# Patient Record
Sex: Female | Born: 1960 | Race: Black or African American | Hispanic: No | Marital: Single | Smoking: Never smoker
Health system: Southern US, Community
[De-identification: ages and names within clinical notes are randomized; demographics above are authoritative.]

## PROBLEM LIST (undated history)

## (undated) DIAGNOSIS — L723 Sebaceous cyst: Secondary | ICD-10-CM

## (undated) DIAGNOSIS — E119 Type 2 diabetes mellitus without complications: Secondary | ICD-10-CM

## (undated) DIAGNOSIS — G4700 Insomnia, unspecified: Secondary | ICD-10-CM

## (undated) DIAGNOSIS — R739 Hyperglycemia, unspecified: Secondary | ICD-10-CM

## (undated) DIAGNOSIS — J302 Other seasonal allergic rhinitis: Secondary | ICD-10-CM

## (undated) DIAGNOSIS — D649 Anemia, unspecified: Secondary | ICD-10-CM

## (undated) DIAGNOSIS — I10 Essential (primary) hypertension: Secondary | ICD-10-CM

## (undated) DIAGNOSIS — Z91018 Allergy to other foods: Secondary | ICD-10-CM

## (undated) DIAGNOSIS — K1120 Sialoadenitis, unspecified: Secondary | ICD-10-CM

## (undated) DIAGNOSIS — N6009 Solitary cyst of unspecified breast: Secondary | ICD-10-CM

## (undated) DIAGNOSIS — B009 Herpesviral infection, unspecified: Secondary | ICD-10-CM

## (undated) DIAGNOSIS — A048 Other specified bacterial intestinal infections: Secondary | ICD-10-CM

## (undated) DIAGNOSIS — K5792 Diverticulitis of intestine, part unspecified, without perforation or abscess without bleeding: Secondary | ICD-10-CM

## (undated) DIAGNOSIS — B353 Tinea pedis: Secondary | ICD-10-CM

## (undated) DIAGNOSIS — E7800 Pure hypercholesterolemia, unspecified: Secondary | ICD-10-CM

## (undated) DIAGNOSIS — L309 Dermatitis, unspecified: Secondary | ICD-10-CM

## (undated) DIAGNOSIS — K5900 Constipation, unspecified: Secondary | ICD-10-CM

## (undated) HISTORY — DX: Other specified bacterial intestinal infections: A04.8

## (undated) HISTORY — DX: Morbid (severe) obesity due to excess calories: E66.01

## (undated) HISTORY — PX: ABDOMINAL HYSTERECTOMY: SHX81

## (undated) HISTORY — DX: Anemia, unspecified: D64.9

## (undated) HISTORY — DX: Tinea pedis: B35.3

## (undated) HISTORY — DX: Sialoadenitis, unspecified: K11.20

## (undated) HISTORY — DX: Insomnia, unspecified: G47.00

## (undated) HISTORY — DX: Sebaceous cyst: L72.3

## (undated) HISTORY — DX: Other seasonal allergic rhinitis: J30.2

## (undated) HISTORY — DX: Allergy to other foods: Z91.018

## (undated) HISTORY — DX: Herpesviral infection, unspecified: B00.9

## (undated) HISTORY — DX: Hyperglycemia, unspecified: R73.9

## (undated) HISTORY — DX: Dermatitis, unspecified: L30.9

## (undated) HISTORY — DX: Solitary cyst of unspecified breast: N60.09

## (undated) HISTORY — DX: Constipation, unspecified: K59.00

---

## 2013-05-09 ENCOUNTER — Encounter (HOSPITAL_COMMUNITY): Payer: Self-pay | Admitting: Emergency Medicine

## 2013-05-09 ENCOUNTER — Emergency Department (INDEPENDENT_AMBULATORY_CARE_PROVIDER_SITE_OTHER)
Admission: EM | Admit: 2013-05-09 | Discharge: 2013-05-09 | Disposition: A | Discharge status: Home / Self Care | Payer: PRIVATE HEALTH INSURANCE | Source: Home / Self Care | Attending: Emergency Medicine | Admitting: Emergency Medicine

## 2013-05-09 DIAGNOSIS — N3000 Acute cystitis without hematuria: Secondary | ICD-10-CM

## 2013-05-09 LAB — POCT URINALYSIS DIP (DEVICE)
Glucose, UA: 100 mg/dL — AB
HGB URINE DIPSTICK: NEGATIVE
Nitrite: POSITIVE — AB
PROTEIN: 100 mg/dL — AB
SPECIFIC GRAVITY, URINE: 1.02 (ref 1.005–1.030)
UROBILINOGEN UA: 2 mg/dL — AB (ref 0.0–1.0)
pH: 5 (ref 5.0–8.0)

## 2013-05-09 LAB — CBC WITH DIFFERENTIAL/PLATELET
BASOS PCT: 0 % (ref 0–1)
Basophils Absolute: 0 10*3/uL (ref 0.0–0.1)
Eosinophils Absolute: 0.1 10*3/uL (ref 0.0–0.7)
Eosinophils Relative: 3 % (ref 0–5)
HCT: 38.9 % (ref 36.0–46.0)
HEMOGLOBIN: 12.6 g/dL (ref 12.0–15.0)
Lymphocytes Relative: 37 % (ref 12–46)
Lymphs Abs: 2.1 10*3/uL (ref 0.7–4.0)
MCH: 27.9 pg (ref 26.0–34.0)
MCHC: 32.4 g/dL (ref 30.0–36.0)
MCV: 86.3 fL (ref 78.0–100.0)
MONOS PCT: 6 % (ref 3–12)
Monocytes Absolute: 0.3 10*3/uL (ref 0.1–1.0)
NEUTROS ABS: 3 10*3/uL (ref 1.7–7.7)
NEUTROS PCT: 54 % (ref 43–77)
Platelets: 245 10*3/uL (ref 150–400)
RBC: 4.51 MIL/uL (ref 3.87–5.11)
RDW: 13.7 % (ref 11.5–15.5)
WBC: 5.5 10*3/uL (ref 4.0–10.5)

## 2013-05-09 MED ORDER — CIPROFLOXACIN HCL 500 MG PO TABS: 500.0000 mg | ORAL_TABLET | Freq: Two times a day (BID) | ORAL | Status: DC

## 2013-05-09 MED ORDER — IBUPROFEN 800 MG PO TABS: 800.0000 mg | ORAL_TABLET | Freq: Three times a day (TID) | ORAL | Status: DC

## 2013-05-09 NOTE — Discharge Instructions (Signed)
Urinary Tract Infection  Urinary tract infections (UTIs) can develop anywhere along your urinary tract. Your urinary tract is your body's drainage system for removing wastes and extra water. Your urinary tract includes two kidneys, two ureters, a bladder, and a urethra. Your kidneys are a pair of bean-shaped organs. Each kidney is about the size of your fist. They are located below your ribs, one on each side of your spine.  CAUSES  Infections are caused by microbes, which are microscopic organisms, including fungi, viruses, and bacteria. These organisms are so small that they can only be seen through a microscope. Bacteria are the microbes that most commonly cause UTIs.  SYMPTOMS   Symptoms of UTIs may vary by age and gender of the patient and by the location of the infection. Symptoms in young women typically include a frequent and intense urge to urinate and a painful, burning feeling in the bladder or urethra during urination. Older women and men are more likely to be tired, shaky, and weak and have muscle aches and abdominal pain. A fever may mean the infection is in your kidneys. Other symptoms of a kidney infection include pain in your back or sides below the ribs, nausea, and vomiting.  DIAGNOSIS  To diagnose a UTI, your caregiver will ask you about your symptoms. Your caregiver also will ask to provide a urine sample. The urine sample will be tested for bacteria and white blood cells. White blood cells are made by your body to help fight infection.  TREATMENT   Typically, UTIs can be treated with medication. Because most UTIs are caused by a bacterial infection, they usually can be treated with the use of antibiotics. The choice of antibiotic and length of treatment depend on your symptoms and the type of bacteria causing your infection.  HOME CARE INSTRUCTIONS   If you were prescribed antibiotics, take them exactly as your caregiver instructs you. Finish the medication even if you feel better after you  have only taken some of the medication.   Drink enough water and fluids to keep your urine clear or pale yellow.   Avoid caffeine, tea, and carbonated beverages. They tend to irritate your bladder.   Empty your bladder often. Avoid holding urine for long periods of time.   Empty your bladder before and after sexual intercourse.   After a bowel movement, women should cleanse from front to back. Use each tissue only once.  SEEK MEDICAL CARE IF:    You have back pain.   You develop a fever.   Your symptoms do not begin to resolve within 3 days.  SEEK IMMEDIATE MEDICAL CARE IF:    You have severe back pain or lower abdominal pain.   You develop chills.   You have nausea or vomiting.   You have continued burning or discomfort with urination.  MAKE SURE YOU:    Understand these instructions.   Will watch your condition.   Will get help right away if you are not doing well or get worse.  Document Released: 10/24/2004 Document Revised: 07/16/2011 Document Reviewed: 02/22/2011  [employer] Patient Information 2014 [employer].

## 2013-05-09 NOTE — ED Notes (Signed)
Reports urinary urgency and frequent urination throughout this past week; on Friday started with RLQ pain.  Denies vaginal discharge, fevers, or n/v.  Has been taking AZO.

## 2013-05-09 NOTE — ED Notes (Signed)
Patient is resting.  Comfort measures offered.  Requesting PO fluids - instructed she needs to be NPO until we have lab result back.  Verbalized understanding.

## 2013-05-09 NOTE — ED Provider Notes (Signed)
Chief Complaint   Chief Complaint  Patient presents with  . Abdominal Pain  . Urinary Tract Infection    History of Present Illness   Katie Mcknight is a 53 year old female who's had a two-day history of pain in the right lower back and right lower quadrant of the abdomen. The pain is constant and rated a 4-8/10 in intensity. It's worse with movement and better when she lies flat. It has been associated with urinary frequency, urgency, and older to the urine. She denies any dysuria or hematuria. She has had no fever, chills, nausea, vomiting, or anorexia. She denies constipation, diarrhea, or blood in the stools. She has had no GYN complaints. She's not sexually active and has had a partial hysterectomy. No prior history of urinary tract infections or stones.  Review of Systems   Other than as noted above, the patient denies any of the following symptoms: Constitutional:  No fever, chills, weight loss or anorexia. Abdomen:  No nausea, vomiting, hematememesis, melena, diarrhea, or hematochezia. GU:  No dysuria, frequency, urgency, or hematuria. Gyn:  No vaginal discharge, itching, abnormal bleeding, dyspareunia, or pelvic pain.  PMFSH   Past medical history, family history, social history, meds, and allergies were reviewed. She is allergic to penicillin.  Physical Exam     Vital signs:  BP 130/81  Pulse 70  Temp(Src) 98.3 F (36.8 C) (Oral)  Resp 16  SpO2 98% Gen:  Alert, oriented, in no distress. Lungs:  Breath sounds clear and equal bilaterally.  No wheezes, rales or rhonchi. Heart:  Regular rhythm.  No gallops or murmers.   Abdomen:  Abdomen was soft, flat, nondistended. No organomegaly or mass. Bowel sounds are normally active. She has mild right lower quadrant tenderness to palpation without guarding or rebound. Skin:  Clear, warm and dry.  No rash.  Labs   Results for orders placed during the hospital encounter of 05/09/13  CBC WITH DIFFERENTIAL      Result Value  Ref Range   WBC 5.5  4.0 - 10.5 K/uL   RBC 4.51  3.87 - 5.11 MIL/uL   Hemoglobin 12.6  12.0 - 15.0 g/dL   HCT 16.138.9  09.636.0 - 04.546.0 %   MCV 86.3  78.0 - 100.0 fL   MCH 27.9  26.0 - 34.0 pg   MCHC 32.4  30.0 - 36.0 g/dL   RDW 40.913.7  81.111.5 - 91.415.5 %   Platelets 245  150 - 400 K/uL   Neutrophils Relative % 54  43 - 77 %   Neutro Abs 3.0  1.7 - 7.7 K/uL   Lymphocytes Relative 37  12 - 46 %   Lymphs Abs 2.1  0.7 - 4.0 K/uL   Monocytes Relative 6  3 - 12 %   Monocytes Absolute 0.3  0.1 - 1.0 K/uL   Eosinophils Relative 3  0 - 5 %   Eosinophils Absolute 0.1  0.0 - 0.7 K/uL   Basophils Relative 0  0 - 1 %   Basophils Absolute 0.0  0.0 - 0.1 K/uL  POCT URINALYSIS DIP (DEVICE)      Result Value Ref Range   Glucose, UA 100 (*) NEGATIVE mg/dL   Bilirubin Urine SMALL (*) NEGATIVE   Ketones, ur TRACE (*) NEGATIVE mg/dL   Specific Gravity, Urine 1.020  1.005 - 1.030   Hgb urine dipstick NEGATIVE  NEGATIVE   pH 5.0  5.0 - 8.0   Protein, ur 100 (*) NEGATIVE mg/dL   Urobilinogen, UA 2.0 (*)  0.0 - 1.0 mg/dL   Nitrite POSITIVE (*) NEGATIVE   Leukocytes, UA LARGE (*) NEGATIVE    Urine was cultured.  Assessment   The encounter diagnosis was Acute cystitis.  No evidence of acute abdomen or appendicitis.  Plan     1.  Meds:  The following meds were prescribed:   Discharge Medication List as of 05/09/2013  4:44 PM    START taking these medications   Details  ciprofloxacin (CIPRO) 500 MG tablet Take 1 tablet (500 mg total) by mouth every 12 (twelve) hours., Starting 05/09/2013, Until Discontinued, Normal    ibuprofen (ADVIL,MOTRIN) 800 MG tablet Take 1 tablet (800 mg total) by mouth 3 (three) times daily., Starting 05/09/2013, Until Discontinued, Normal        2.  Patient Education/Counseling:  The patient was given appropriate handouts, self care instructions, and instructed in symptomatic relief.  Get extra fluids and followup with her primary care doctor back in [place], [place] when she returns  home.  3.  Follow up:  The patient was told to follow up here if no better in 3 to 4 days, or sooner if becoming worse in any way, and given some red flag symptoms such as worsening pain, fever, vomiting, or evidence of GI bleeding which would prompt immediate return.  Follow up here as necessary.    Reuben Likes, MD 05/09/13 [phone]

## 2013-05-09 NOTE — ED Notes (Signed)
Redrawn CBC per lab request.

## 2013-05-10 LAB — URINE CULTURE

## 2013-05-10 NOTE — Progress Notes (Signed)
Quick Note:  Test result was normal. No further action is needed at this time. ______ 

## 2015-06-05 ENCOUNTER — Emergency Department (HOSPITAL_COMMUNITY)
Admission: EM | Admit: 2015-06-05 | Discharge: 2015-06-05 | Disposition: A | Discharge status: Home / Self Care | Payer: No Typology Code available for payment source | Attending: Emergency Medicine | Admitting: Emergency Medicine

## 2015-06-05 ENCOUNTER — Encounter (HOSPITAL_COMMUNITY): Payer: Self-pay | Admitting: *Deleted

## 2015-06-05 DIAGNOSIS — Y998 Other external cause status: Secondary | ICD-10-CM | POA: Diagnosis not present

## 2015-06-05 DIAGNOSIS — S0990XA Unspecified injury of head, initial encounter: Secondary | ICD-10-CM | POA: Insufficient documentation

## 2015-06-05 DIAGNOSIS — Y92410 Unspecified street and highway as the place of occurrence of the external cause: Secondary | ICD-10-CM | POA: Diagnosis not present

## 2015-06-05 DIAGNOSIS — Z880 Allergy status to penicillin: Secondary | ICD-10-CM | POA: Insufficient documentation

## 2015-06-05 DIAGNOSIS — Z791 Long term (current) use of non-steroidal anti-inflammatories (NSAID): Secondary | ICD-10-CM | POA: Diagnosis not present

## 2015-06-05 DIAGNOSIS — S6992XA Unspecified injury of left wrist, hand and finger(s), initial encounter: Secondary | ICD-10-CM | POA: Insufficient documentation

## 2015-06-05 DIAGNOSIS — S199XXA Unspecified injury of neck, initial encounter: Secondary | ICD-10-CM | POA: Diagnosis not present

## 2015-06-05 DIAGNOSIS — Y9389 Activity, other specified: Secondary | ICD-10-CM | POA: Diagnosis not present

## 2015-06-05 DIAGNOSIS — S29002A Unspecified injury of muscle and tendon of back wall of thorax, initial encounter: Secondary | ICD-10-CM | POA: Insufficient documentation

## 2015-06-05 DIAGNOSIS — S4991XA Unspecified injury of right shoulder and upper arm, initial encounter: Secondary | ICD-10-CM | POA: Insufficient documentation

## 2015-06-05 DIAGNOSIS — S99912A Unspecified injury of left ankle, initial encounter: Secondary | ICD-10-CM | POA: Diagnosis not present

## 2015-06-05 MED ORDER — METHOCARBAMOL 500 MG PO TABS: 500.0000 mg | ORAL_TABLET | Freq: Two times a day (BID) | ORAL | Status: DC

## 2015-06-05 MED ORDER — IBUPROFEN 800 MG PO TABS: 800.0000 mg | ORAL_TABLET | Freq: Three times a day (TID) | ORAL | Status: DC
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
## 2015-06-05 NOTE — ED Notes (Signed)
Declined W/C at D/C and was escorted to lobby by RN. 

## 2015-06-05 NOTE — Discharge Instructions (Signed)

## 2015-11-02 ENCOUNTER — Other Ambulatory Visit: Payer: Self-pay | Admitting: Family Medicine

## 2015-11-02 DIAGNOSIS — Z1231 Encounter for screening mammogram for malignant neoplasm of breast: Secondary | ICD-10-CM

## 2015-11-08 ENCOUNTER — Ambulatory Visit
Admission: RE | Admit: 2015-11-08 | Discharge: 2015-11-08 | Disposition: A | Discharge status: Home / Self Care | Payer: Managed Care, Other (non HMO) | Source: Ambulatory Visit | Attending: Family Medicine | Admitting: Family Medicine

## 2015-11-08 DIAGNOSIS — Z1231 Encounter for screening mammogram for malignant neoplasm of breast: Secondary | ICD-10-CM

## 2016-11-11 ENCOUNTER — Other Ambulatory Visit: Payer: Self-pay | Admitting: Family Medicine

## 2016-11-11 DIAGNOSIS — R1031 Right lower quadrant pain: Secondary | ICD-10-CM

## 2016-11-18 ENCOUNTER — Other Ambulatory Visit: Payer: Managed Care, Other (non HMO)

## 2017-02-07 ENCOUNTER — Encounter: Payer: Self-pay | Admitting: Registered"

## 2017-02-07 ENCOUNTER — Encounter: Payer: Managed Care, Other (non HMO) | Attending: Family Medicine | Admitting: Registered"

## 2017-02-07 DIAGNOSIS — Z713 Dietary counseling and surveillance: Secondary | ICD-10-CM | POA: Diagnosis not present

## 2017-02-07 DIAGNOSIS — E663 Overweight: Secondary | ICD-10-CM | POA: Insufficient documentation

## 2017-02-07 NOTE — Progress Notes (Signed)
Medical Nutrition Therapy:  Appt start time: 1100 end time:  1200.  Assessment:  Primary concerns today: Patient states she has a family history of diabetes and other health issues and wants to take action to prevent health issues. Per referral lab she has a diagnosis of hyperglycemia (pre-diabetes?). Patient states she wants to understand how to read labels.   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 Patient states she worked nights and since retirement she has continued to stay awake during the night and has an irregular sleep pattern. Patient reports not getting adequate sleep and will be checking with Dr. Duanne Guessewey tomorrow regarding her concerns of potential sleep apnea.   Patient is motivated to have healthy habits not only for herself, but also to help instill this in her daughter.   Patient states she will be Preferred Learning Style:   No preference indicated   Learning Readiness:   Change in progress  MEDICATIONS: reviewed   DIETARY INTAKE:  Usual eating pattern includes 3 meals and 1-2 snacks per day.  Everyday foods include salads.  Avoided foods include food allergies, tomatoes and citrus.    24-hr recall:  B ( AM): bacon, hard boiled egg, 1/2 toast OR cream of wheat OR oatmeal OR frozen fruit smoothie  Snk ( AM): none  L ( PM): salads, chicken or tuna OR eating out fish or chicken, eats only 1/2 bun Snk ( PM): none D ( PM): salmon/chicken, greens, sometimes a little rice or pasta Snk ( PM): (while watching TV) sunflower seeds or chips Beverages: water, soda with Sunday dinner, seltzer water, ginseng green tea  Usual physical activity: ADLs Plans to start walking. Used to walk 5-6 miles 2-3 week. May get bike  Estimated energy needs: 1800 calories 200 g carbohydrates 113 g protein 60 g fat  Progress Towards Goal(s):  In progress.   Nutritional Diagnosis:  NB-1.1 Food and nutrition-related knowledge deficit As related to sources of concentrated  sweets.  As evidenced by belief that green tea choice was low in sugar due to taste and not reading label and diagnosis of hyperglycemia.    Intervention:  Nutrition Education. Described the role of different macronutrients on glucose. Stated what foods contain the most carbohydrates. Demonstrated how to read Nutrition Facts food label. Discussed omega 3 food sources. Discussed importance of sleep and exercise on health.   Teaching Method Utilized:  Visual Auditory  Handouts given during visit include:  Fish guide  My Plate Planner  Carb sheet  Smoothie recipes  Sleep Hygiene  A1c chart  Nutrition Facts Label  Types of Sugar  Barriers to learning/adherence to lifestyle change: none  Demonstrated degree of understanding via:  Teach Back   Monitoring/Evaluation:  Dietary intake, exercise, fasting glucose labs, and body weight in 4 month(s).

## 2017-02-07 NOTE — Patient Instructions (Addendum)
Consider replacing the green tea with something else with less sugar. Aim to eat balanced meals and snacks including a protein to keep your blood sugar in good control. Work on getting into a regular exercise routine. Aim to get 7-8 hours sleep per night Ask your doctor what your blood sugar number is and A1c if she has done this test.

## 2017-02-20 ENCOUNTER — Other Ambulatory Visit: Payer: Self-pay | Admitting: Family Medicine

## 2017-02-20 DIAGNOSIS — Z1231 Encounter for screening mammogram for malignant neoplasm of breast: Secondary | ICD-10-CM

## 2017-03-11 ENCOUNTER — Ambulatory Visit: Payer: Managed Care, Other (non HMO)

## 2017-03-20 ENCOUNTER — Institutional Professional Consult (permissible substitution): Payer: Self-pay | Admitting: Neurology

## 2017-03-31 ENCOUNTER — Encounter: Payer: Self-pay | Admitting: Neurology

## 2017-03-31 ENCOUNTER — Ambulatory Visit: Payer: Managed Care, Other (non HMO) | Admitting: Neurology

## 2017-03-31 ENCOUNTER — Encounter (INDEPENDENT_AMBULATORY_CARE_PROVIDER_SITE_OTHER): Payer: Self-pay

## 2017-03-31 VITALS — BP 128/80 | HR 76 | Ht 65.0 in | Wt 209.0 lb

## 2017-03-31 DIAGNOSIS — G4719 Other hypersomnia: Secondary | ICD-10-CM

## 2017-03-31 DIAGNOSIS — R0683 Snoring: Secondary | ICD-10-CM | POA: Diagnosis not present

## 2017-03-31 DIAGNOSIS — G478 Other sleep disorders: Secondary | ICD-10-CM

## 2017-03-31 DIAGNOSIS — R0681 Apnea, not elsewhere classified: Secondary | ICD-10-CM | POA: Diagnosis not present

## 2017-03-31 DIAGNOSIS — E669 Obesity, unspecified: Secondary | ICD-10-CM

## 2017-03-31 DIAGNOSIS — R351 Nocturia: Secondary | ICD-10-CM

## 2017-03-31 DIAGNOSIS — R002 Palpitations: Secondary | ICD-10-CM | POA: Diagnosis not present

## 2017-03-31 DIAGNOSIS — G4762 Sleep related leg cramps: Secondary | ICD-10-CM | POA: Diagnosis not present

## 2017-03-31 DIAGNOSIS — G479 Sleep disorder, unspecified: Secondary | ICD-10-CM | POA: Diagnosis not present

## 2017-03-31 NOTE — Progress Notes (Signed)
Subjective:    Patient ID: Katie Mcknight is a 57 y.o. female.  HPI     Huston Foley, MD, PhD [facility] [address] [address] [place], [place] [address]  Dear Katie Mcknight,  I saw your patient, Katie Mcknight, upon your kind request in my neurologic clinic today for initial consultation of her sleep disorder, in particular, concern for underlying obstructive sleep apnea. The patient is unaccompanied today. As you know, Katie Mcknight is a 57 year old right-handed woman with an underlying medical history of vitamin D deficiency, allergic rhinitis, anemia, irritable bowel syndrome and obesity, who reports snoring and excessive daytime somnolence. She has had witnessed breathing pauses while asleep per family. I reviewed your office note from 02/07/2017, which you kindly included. Her Epworth Sleepiness Scale score is 924 today, fatigue score is 42 out of 63. She wakes up with a sense of gasping, palpitations and shortness of breath. She gets scared at night. She tries to walk around and leave the bedroom when she wakes up with a jolt or startle like this. She lives with her daughter, she is retired. She has 2 children. Her older daughter is 56 years old and lives and works in [place], her younger daughter is 109 and in high school. She is a retired Emergency planning/management officer, retired some 3 years ago. She does not have a very good sleep awake schedule she admits. She goes to bed around 11 but due to her prior work schedule she is awake until early morning hours typically, watches TV in bed, reading, is more active at night she reports. She may be asleep by 3 AM typically. She wakes up with foot cramping at times. She tries to hydrate well. She does not drink caffeine on a day-to-day basis, does not drink alcohol on a regular basis, is a nonsmoker. Right time varies. She gets up at 7 for her daughter and then goes back to bed and typically may sleep until 3 PM or so. She is not aware  of any family history of OSA. He denies telltale symptoms of restless leg syndrome. She does not typically wake up with a headache, she has nocturia about once per average night.  Her Past Medical History Is Significant For: No past medical history on file.  Her Past Surgical History Is Significant For: Past Surgical History:  Procedure Laterality Date  . ABDOMINAL HYSTERECTOMY     partial    Her Family History Is Significant For: No family history on file.  Her Social History Is Significant For: Social History   Socioeconomic History  . Marital status: Single    Spouse name: None  . Number of children: None  . Years of education: None  . Highest education level: None  Social Needs  . Financial resource strain: None  . Food insecurity - worry: None  . Food insecurity - inability: None  . Transportation needs - medical: None  . Transportation needs - non-medical: None  Occupational History  . None  Tobacco Use  . Smoking status: Never Smoker  . Smokeless tobacco: Never Used  Substance and Sexual Activity  . Alcohol use: No  . Drug use: No  . Sexual activity: No  Other Topics Concern  . None  Social History Narrative  . None    Her Allergies Are:  Allergies  Allergen Reactions  . Penicillins   :   Her Current Medications Are:  Outpatient Encounter Medications as of 03/31/2017  Medication Sig  .  Cholecalciferol (VITAMIN D-3) 5000 units TABS Take by mouth.  . hydrochlorothiazide (HYDRODIURIL) 25 MG tablet Take 25 mg by mouth daily.  . [DISCONTINUED] Docusate Sodium (COLACE PO) Take by mouth.  . [DISCONTINUED] ibuprofen (ADVIL,MOTRIN) 800 MG tablet Take 1 tablet (800 mg total) by mouth 3 (three) times daily.   No facility-administered encounter medications on file as of 03/31/2017.   :  Review of Systems:  Out of a complete 14 point review of systems, all are reviewed and negative with the exception of these symptoms as listed below: Review of Systems   Neurological:       Pt presents today to discuss her sleep. Pt has never had a sleep study but does endorse snoring.  Epworth Sleepiness Scale 0= would never doze 1= slight chance of dozing 2= moderate chance of dozing 3= high chance of dozing  Sitting and reading: 1 Watching TV: 2 Sitting inactive in a public place (ex. Theater or meeting): 0 As a passenger in a car for an hour without a break: 1 Lying down to rest in the afternoon: 3 Sitting and talking to someone: 0 Sitting quietly after lunch (no alcohol): 1 In a car, while stopped in traffic: 1 Total: 9     Objective:  Neurological Exam  Physical Exam Physical Examination:   Vitals:   03/31/17 1324  BP: 128/80  Pulse: 76   General Examination: The patient is a very pleasant 57 y.o. female in no acute distress. She appears well-developed and well-nourished and well groomed.   HEENT: Normocephalic, atraumatic, pupils are equal, round and reactive to light and accommodation. Extraocular tracking is good without limitation to gaze excursion or nystagmus noted. Normal smooth pursuit is noted. Hearing is grossly intact. Tympanic membranes are clear bilaterally. Face is symmetric with normal facial animation and normal facial sensation. Speech is clear with no dysarthria noted. There is no hypophonia. There is no lip, neck/head, jaw or voice tremor. Neck is supple with full range of passive and active motion. There are no carotid bruits on auscultation. Oropharynx exam reveals: mild mouth dryness, adequate dental hygiene and moderate airway crowding, due to smaller airway entry. Mallampati is class II. Tongue protrudes centrally and palate elevates symmetrically. Tonsils are 1+ to 2+ in size. Neck size is 16 inches. She has a Mild overbite.    Chest: Clear to auscultation without wheezing, rhonchi or crackles noted.  Heart: S1+S2+0, regular and normal without murmurs, rubs or gallops noted.   Abdomen: Soft, non-tender and  non-distended with normal bowel sounds appreciated on auscultation.  Extremities: There is no pitting edema in the distal lower extremities bilaterally.    Skin: Warm and dry without trophic changes noted.   Musculoskeletal: exam reveals no obvious joint deformities, tenderness or joint swelling or erythema.   Neurologically:  Mental status: The patient is awake, alert and oriented in all 4 spheres. Her immediate and remote memory, attention, language skills and fund of knowledge are appropriate. There is no evidence of aphasia, agnosia, apraxia or anomia. Speech is clear with normal prosody and enunciation. Thought process is linear. Mood is normal and affect is normal.  Cranial nerves II - XII are as described above under HEENT exam. In addition: shoulder shrug is normal with equal shoulder height noted. Motor exam: Normal bulk, strength and tone is noted. There is no drift, tremor or rebound. Romberg is negative. Reflexes are 1+ in the UEs and knees. Fine motor skills and coordination: intact with normal finger taps, normal hand movements,  normal rapid alternating patting, normal foot taps and normal foot agility.  Cerebellar testing: No dysmetria or intention tremor on finger to nose testing. Heel to shin is unremarkable bilaterally. There is no truncal or gait ataxia.  Sensory exam: intact to light touch in the upper and lower extremities.  Gait, station and balance: She stands easily. No veering to one side is noted. No leaning to one side is noted. Posture is age-appropriate and stance is narrow based. Gait shows normal stride length and normal pace. No problems turning are noted. Tandem walk is unremarkable. Intact toe and heel stance is noted.               Assessment and Plan:   In summary, Katie Mcknight is a very pleasant 57 y.o.-year old female with an underlying medical history of vitamin D deficiency, allergic rhinitis, anemia, irritable bowel syndrome and obesity, whose history  and physical exam are concerning for obstructive sleep apnea (OSA). I had a long chat with the patient about my findings and the diagnosis of OSA, its prognosis and treatment options. We talked about medical treatments, surgical interventions and non-pharmacological approaches. I explained in particular the risks and ramifications of untreated moderate to severe OSA, especially with respect to developing cardiovascular disease down the Road, including congestive heart failure, difficult to treat hypertension, cardiac arrhythmias, or stroke. Even type 2 diabetes has, in part, been linked to untreated OSA. Symptoms of untreated OSA include daytime sleepiness, memory problems, mood irritability and mood disorder such as depression and anxiety, lack of energy, as well as recurrent headaches, especially morning headaches. We talked about trying to maintain a healthy lifestyle in general, as well as the importance of weight control. I encouraged the patient to eat healthy, exercise daily and keep well hydrated, to keep a scheduled bedtime and wake time routine, to not skip any meals and eat healthy snacks in between meals. I advised the patient not to drive when feeling sleepy. I recommended the following at this time: sleep study with potential positive airway pressure titration. (We will score hypopneas at 4%).   I explained the sleep test procedure to the patient and also outlined possible surgical and non-surgical treatment options of OSA, including the use of a custom-made dental device (which would require a referral to a specialist dentist or oral surgeon), upper airway surgical options, such as pillar implants, radiofrequency surgery, tongue base surgery, and UPPP (which would involve a referral to an ENT surgeon). Rarely, jaw surgery such as mandibular advancement may be considered.  I also explained the CPAP treatment option to the patient, who indicated that she would be willing to try CPAP if the need  arises. I explained the importance of being compliant with PAP treatment, not only for insurance purposes but primarily to improve Her symptoms, and for the patient's long term health benefit, including to reduce Her cardiovascular risks. I answered all her questions today and the patient was in agreement. I would like to see her back after the sleep study is completed and encouraged her to call with any interim questions, concerns, problems or updates.   Thank you very much for allowing me to participate in the care of this nice patient. If I can be of any further assistance to you please do not hesitate to call me at [phone].  Sincerely,   Huston Foley, MD, PhD

## 2017-03-31 NOTE — Patient Instructions (Addendum)

## 2017-04-03 ENCOUNTER — Telehealth: Payer: Self-pay

## 2017-04-03 DIAGNOSIS — G4719 Other hypersomnia: Secondary | ICD-10-CM

## 2017-04-03 NOTE — Telephone Encounter (Signed)
Will wait until Dr. Athar's return to address. 

## 2017-04-03 NOTE — Telephone Encounter (Signed)
[employer] denied in lab sleep study. Need HST order

## 2017-04-07 NOTE — Telephone Encounter (Signed)
HST order placed. 

## 2017-04-23 ENCOUNTER — Encounter (HOSPITAL_COMMUNITY): Payer: Self-pay | Admitting: *Deleted

## 2017-04-23 ENCOUNTER — Emergency Department (HOSPITAL_COMMUNITY)
Admission: EM | Admit: 2017-04-23 | Discharge: 2017-04-24 | Disposition: A | Discharge status: Home / Self Care | Payer: Managed Care, Other (non HMO) | Attending: Emergency Medicine | Admitting: Emergency Medicine

## 2017-04-23 DIAGNOSIS — Z79899 Other long term (current) drug therapy: Secondary | ICD-10-CM | POA: Insufficient documentation

## 2017-04-23 DIAGNOSIS — R112 Nausea with vomiting, unspecified: Secondary | ICD-10-CM | POA: Diagnosis present

## 2017-04-23 DIAGNOSIS — K529 Noninfective gastroenteritis and colitis, unspecified: Secondary | ICD-10-CM | POA: Insufficient documentation

## 2017-04-23 LAB — COMPREHENSIVE METABOLIC PANEL
ALT: 29 U/L (ref 14–54)
AST: 26 U/L (ref 15–41)
Albumin: 4.4 g/dL (ref 3.5–5.0)
Alkaline Phosphatase: 49 U/L (ref 38–126)
Anion gap: 12 (ref 5–15)
BILIRUBIN TOTAL: 0.7 mg/dL (ref 0.3–1.2)
BUN: 16 mg/dL (ref 6–20)
CO2: 25 mmol/L (ref 22–32)
CREATININE: 0.91 mg/dL (ref 0.44–1.00)
Calcium: 9.4 mg/dL (ref 8.9–10.3)
Chloride: 102 mmol/L (ref 101–111)
GFR calc Af Amer: >60 mL/min (ref >60)
Glucose, Bld: 119 mg/dL — ABNORMAL HIGH (ref 65–99)
Potassium: 3.9 mmol/L (ref 3.5–5.1)
Sodium: 139 mmol/L (ref 135–145)
Total Protein: 7.9 g/dL (ref 6.5–8.1)

## 2017-04-23 LAB — URINALYSIS, ROUTINE W REFLEX MICROSCOPIC
Bilirubin Urine: NEGATIVE
GLUCOSE, UA: NEGATIVE mg/dL
Hgb urine dipstick: NEGATIVE
Ketones, ur: NEGATIVE mg/dL
Nitrite: NEGATIVE
PH: 6 (ref 5.0–8.0)
PROTEIN: NEGATIVE mg/dL
SPECIFIC GRAVITY, URINE: 1.015 (ref 1.005–1.030)

## 2017-04-23 LAB — CBC
HCT: 41.1 % (ref 36.0–46.0)
Hemoglobin: 13 g/dL (ref 12.0–15.0)
MCH: 27.8 pg (ref 26.0–34.0)
MCHC: 31.6 g/dL (ref 30.0–36.0)
MCV: 88 fL (ref 78.0–100.0)
Platelets: 218 10*3/uL (ref 150–400)
RBC: 4.67 MIL/uL (ref 3.87–5.11)
RDW: 14.2 % (ref 11.5–15.5)
WBC: 9.6 10*3/uL (ref 4.0–10.5)

## 2017-04-23 LAB — LIPASE, BLOOD: Lipase: 28 U/L (ref 11–51)

## 2017-04-23 MED ORDER — ONDANSETRON 4 MG PO TBDP: ORAL_TABLET | ORAL | 0 refills | Status: AC

## 2017-04-23 MED ORDER — SODIUM CHLORIDE 0.9 % IV BOLUS
1000.0000 mL | Freq: Once | INTRAVENOUS | Status: AC
  Administered 2017-04-23: 1000 mL via INTRAVENOUS

## 2017-04-23 MED ORDER — ONDANSETRON HCL 4 MG/2ML IJ SOLN
4.0000 mg | Freq: Once | INTRAMUSCULAR | Status: AC
  Administered 2017-04-23: 4 mg via INTRAVENOUS
  Filled 2017-04-23: qty 2

## 2017-04-23 NOTE — ED Provider Notes (Signed)
[facility]-EMERGENCY DEPT Provider Note   CSN: [id number] Arrival date & time: 04/23/17  1541     History   Chief Complaint Chief Complaint  Patient presents with  . Nausea  . Abdominal Pain    HPI Katie Mcknight is a 57 y.o. female.  Patient is a 57 year old female who presents with nausea vomiting diarrhea.  She states that she ate some cream of wheat this morning and definitely and then later this afternoon he had sudden onset of cramping in her stomach that resulted in diarrhea and nausea and vomiting.  While she was in the bathroom having diarrhea, she had a near syncopal event where she got dizzy and fell to the ground.  She did not get injured.  There was no syncope.  She states she had similar episodes of dizziness in the past.  She feels better but still feels generally weak.  She still having some abdominal cramping and generalized tenderness.  She still has some nausea.  No fevers.  No hematemesis.  No bilious emesis.  No blood in her stool.  No known sick contacts.     History reviewed. No pertinent past medical history.  There are no active problems to display for this patient.   Past Surgical History:  Procedure Laterality Date  . ABDOMINAL HYSTERECTOMY     partial     OB History   None      Home Medications    Prior to Admission medications   Medication Sig Start Date End Date Taking? Authorizing Provider  Cholecalciferol (VITAMIN D-3) 5000 units TABS Take 5,000 Units by mouth daily.    Yes [provider]  hydrochlorothiazide (HYDRODIURIL) 25 MG tablet Take 25 mg by mouth daily.   Yes [provider]  ondansetron (ZOFRAN ODT) 4 MG disintegrating tablet 4mg  ODT q4 hours prn nausea/vomit 04/23/17   Rolan BuccoBelfi, Twylla Arceneaux, MD    Family History No family history on file.  Social History Social History   Tobacco Use  . Smoking status: Never Smoker  . Smokeless tobacco: Never Used  Substance Use Topics  .  Alcohol use: No  . Drug use: No     Allergies   Penicillins   Review of Systems Review of Systems  Constitutional: Positive for fatigue. Negative for chills, diaphoresis and fever.  HENT: Negative for congestion, rhinorrhea and sneezing.   Eyes: Negative.   Respiratory: Negative for cough, chest tightness and shortness of breath.   Cardiovascular: Negative for chest pain and leg swelling.  Gastrointestinal: Positive for abdominal pain, diarrhea, nausea and vomiting. Negative for blood in stool.  Genitourinary: Negative for difficulty urinating, flank pain, frequency and hematuria.  Musculoskeletal: Negative for arthralgias and back pain.  Skin: Negative for rash.  Neurological: Positive for light-headedness. Negative for dizziness, speech difficulty, weakness, numbness and headaches.     Physical Exam Updated Vital Signs BP (!) 158/86   Pulse 80   Temp 98.1 F (36.7 C) (Oral)   Resp (!) 24   SpO2 100%   Physical Exam  Constitutional: She is oriented to person, place, and time. She appears well-developed and well-nourished.  HENT:  Head: Normocephalic and atraumatic.  Eyes: Pupils are equal, round, and reactive to light.  Neck: Normal range of motion. Neck supple.  Cardiovascular: Normal rate, regular rhythm and normal heart sounds.  Pulmonary/Chest: Effort normal and breath sounds normal. No respiratory distress. She has no wheezes. She has no rales. She exhibits no tenderness.  Abdominal: Soft. Bowel sounds  are normal. There is generalized tenderness. There is no rebound and no guarding.  Musculoskeletal: Normal range of motion. She exhibits no edema.  Lymphadenopathy:    She has no cervical adenopathy.  Neurological: She is alert and oriented to person, place, and time.   motor 5 out of 5 all extremities, sensation grossly intact light touch all extremities  Skin: Skin is warm and dry. No rash noted.  Psychiatric: She has a normal mood and affect.     ED  Treatments / Results  Labs (all labs ordered are listed, but only abnormal results are displayed) Labs Reviewed  COMPREHENSIVE METABOLIC PANEL - Abnormal; Notable for the following components:      Result Value   Glucose, Bld 119 (*)    All other components within normal limits  URINALYSIS, ROUTINE W REFLEX MICROSCOPIC - Abnormal; Notable for the following components:   Leukocytes, UA TRACE (*)    Bacteria, UA RARE (*)    Squamous Epithelial / LPF 0-5 (*)    All other components within normal limits  LIPASE, BLOOD  CBC    EKG EKG Interpretation  Date/Time:  Wednesday April 23 2017 21:38:48 EDT Ventricular Rate:  84 PR Interval:    QRS Duration: 81 QT Interval:  382 QTC Calculation: 452 R Axis:   82 Text Interpretation:  Sinus rhythm Borderline T abnormalities, anterior leads No old tracing to compare Confirmed by Rolan Bucco [phone]) on 04/23/2017 11:25:24 PM   Radiology No results found.  Procedures Procedures (including critical care time)  Medications Ordered in ED Medications  sodium chloride 0.9 % bolus 1,000 mL (0 mLs Intravenous Stopped 04/23/17 2319)  ondansetron (ZOFRAN) injection 4 mg (4 mg Intravenous Given 04/23/17 2106)     Initial Impression / Assessment and Plan / ED Course  I have reviewed the triage vital signs and the nursing notes.  Pertinent labs & imaging results that were available during my care of the patient were reviewed by me and considered in my medical decision making (see chart for details).    Patient is a 57 year old female who presents with sudden onset of diarrhea associated with nausea and vomiting.  She had a brief near syncopal type event.  She had similar events in the past.  It sounds most likely to be a vasovagal type spell.  She initially had some mild generalized abdominal pain but after IV fluids and Zofran she is feeling better.  Her repeat abdominal exam is benign.  She is not complaining of any ongoing pain.  She is able to  ambulate without dizziness.  She is tolerating oral fluids.  I feel her symptoms are likely viral in nature.  Her labs are non-concerning.  She was discharged home in good condition.  She was given a prescription for Zofran.  Return precautions were given.   Final Clinical Impressions(s) / ED Diagnoses   Final diagnoses:  Gastroenteritis    ED Discharge Orders        Ordered    ondansetron (ZOFRAN ODT) 4 MG disintegrating tablet     04/23/17 2342       Rolan Bucco, MD 04/23/17 2346

## 2017-04-23 NOTE — ED Triage Notes (Signed)
Per EMS, pt from home complains of n/v/d for the past day. Pt states she has generalized abdominal pain.  BP 116/76 HR 60 RR 16 O2 98% CBG 107

## 2017-04-23 NOTE — ED Notes (Signed)
Ambulated patient to the bathroom and back to stretcher. Patient tolerated it well. Also, provided patient a Mal[place]urkey sandwich, saltine crackers, and ginger ale.

## 2017-04-23 NOTE — ED Notes (Signed)
Provided patient ice chips.  

## 2017-04-23 NOTE — ED Notes (Signed)
Main lab is going to attempt for blood samples.

## 2017-04-30 ENCOUNTER — Ambulatory Visit (INDEPENDENT_AMBULATORY_CARE_PROVIDER_SITE_OTHER): Payer: Managed Care, Other (non HMO) | Admitting: Neurology

## 2017-04-30 DIAGNOSIS — R0683 Snoring: Secondary | ICD-10-CM

## 2017-04-30 DIAGNOSIS — G4719 Other hypersomnia: Secondary | ICD-10-CM

## 2017-04-30 DIAGNOSIS — G4710 Hypersomnia, unspecified: Secondary | ICD-10-CM

## 2017-05-06 ENCOUNTER — Telehealth: Payer: Self-pay

## 2017-05-06 NOTE — Procedures (Signed)
  [facility] [address] [place], [place] [address] NAME:  Katie ChurchDeidra Haubner                                                                    DOB: 08/20/1960 MEDICAL RECORD [id number]                                                     DOS: 04/30/2017 REFERRING PHYSICIAN: Maryelizabeth RowanElizabeth Dewey, MD STUDY PERFORMED: Home Sleep Test  HISTORY: 57 year old woman with a history of vitamin D deficiency, allergic rhinitis, anemia, irritable bowel syndrome and obesity, who reports snoring and excessive daytime somnolence. Her Epworth Sleepiness Scale score is 9/24. BMI: 34.7.   STUDY RESULTS:  Total Recording Time: 6 hours, 12 minutes Total Apnea/Hypopnea Index (AHI): 4.5/h, RDI was 8.6/h Average Oxygen Saturation: 95%, Lowest Oxygen Desaturation: 88%  Total Time Oxygen Saturation Below or at 88% was 0.230minutes  Average Heart Rate: 70 bpm Snoring events: 394 IMPRESSION: Primary snoring RECOMMENDATION: This study does not demonstrate any significant obstructive or central sleep disordered breathing. Some snoring was noted. For disturbing snoring, an oral appliance (through a qualified dentist) can be considered. This study does not support an intrinsic sleep disorder as a cause of the patient's symptoms. Other causes, including circadian rhythm disturbances, an underlying mood disorder, medication effect and/or an underlying medical problem cannot be ruled out. The patient should be cautioned not to drive, work at heights, or operate dangerous or heavy equipment when tired or sleepy. Review and reiteration of good sleep hygiene measures should be pursued with any patient. The patient can follow-up with her referring provider, who will be notified of the test results. I certify that I have reviewed the raw data recording prior to the issuance of this report in accordance with the standards of the American Academy of Sleep Medicine (AASM). Huston FoleySaima Cortasia Screws, MD, PhD Diplomat, ABPN  (Neurology and Sleep)

## 2017-05-06 NOTE — Telephone Encounter (Signed)
I called pt to discuss her sleep study results. No answer, left a message asking her to call me back. 

## 2017-05-06 NOTE — Telephone Encounter (Signed)
-----   Message from Huston FoleySaima Athar, MD sent at 05/06/2017  2:16 PM EDT ----- Patient referred by Dr. Duanne Guessewey, seen by me on 03/31/17, HST on 04/30/17.   Please call and notify the patient that the recent home sleep test did not show any significant obstructive sleep apnea. Some snoring was noted. For disturbing snoring, an oral appliance (through a qualified dentist) can be considered.  Please remind patient to try to maintain good sleep hygiene, which means: Keep a regular sleep and wake schedule and make enough time for sleep (7 1/2 to 8 1/2 hours for the average adult), try not to exercise or have a meal within 2 hours of your bedtime, try to keep your bedroom conducive for sleep, that is, cool and dark, without light distractors such as an illuminated alarm clock, and refrain from watching TV right before sleep or in the middle of the night and do not keep the TV or radio on during the night. If a nightlight is used, have it away from the visual field. Also, try not to use or play on electronic devices at bedtime, such as your cell phone, tablet PC or laptop. If you like to read at bedtime on an electronic device, try to dim the background light as much as possible. Do not eat in the middle of the night. Keep pets away from the bedroom environment. For stress relief, try meditation, deep breathing exercises (there are many books and CDs available), a white noise machine or fan can help to diffuse other noise distractors, such as traffic noise. Do not drink alcohol before bedtime, as it can disturb sleep and cause middle of the night awakenings. Never mix alcohol and sedating medications! Avoid narcotic pain medication close to bedtime, as opioids/narcotics can suppress breathing drive and breathing effort.   She can follow up with the referring provider.   Thanks,  Huston FoleySaima Athar, MD, PhD [facility]

## 2017-05-06 NOTE — Progress Notes (Signed)
Patient referred by Dr. Duanne Guessewey, seen by me on 03/31/17, HST on 04/30/17.   Please call and notify the patient that the recent home sleep test did not show any significant obstructive sleep apnea. Some snoring was noted. For disturbing snoring, an oral appliance (through a qualified dentist) can be considered.  Please remind patient to try to maintain good sleep hygiene, which means: Keep a regular sleep and wake schedule and make enough time for sleep (7 1/2 to 8 1/2 hours for the average adult), try not to exercise or have a meal within 2 hours of your bedtime, try to keep your bedroom conducive for sleep, that is, cool and dark, without light distractors such as an illuminated alarm clock, and refrain from watching TV right before sleep or in the middle of the night and do not keep the TV or radio on during the night. If a nightlight is used, have it away from the visual field. Also, try not to use or play on electronic devices at bedtime, such as your cell phone, tablet PC or laptop. If you like to read at bedtime on an electronic device, try to dim the background light as much as possible. Do not eat in the middle of the night. Keep pets away from the bedroom environment. For stress relief, try meditation, deep breathing exercises (there are many books and CDs available), a white noise machine or fan can help to diffuse other noise distractors, such as traffic noise. Do not drink alcohol before bedtime, as it can disturb sleep and cause middle of the night awakenings. Never mix alcohol and sedating medications! Avoid narcotic pain medication close to bedtime, as opioids/narcotics can suppress breathing drive and breathing effort.   She can follow up with the referring provider.   Thanks,  Huston FoleySaima Cieanna Stormes, MD, PhD [facility]

## 2017-05-07 NOTE — Telephone Encounter (Signed)
I called pt to discuss her sleep study results again. No answer, left a message asking her to call me back. 

## 2017-05-08 NOTE — Telephone Encounter (Signed)
I called pt again to discuss her sleep study results. No answer, left a message asking her to call me back. This is my third unsuccessful attempt at reaching pt by phone. Will send pt a letter asking her to call me back.

## 2017-05-08 NOTE — Telephone Encounter (Signed)
Pt returned my call. I advised pt that Dr. Frances FurbishAthar reviewed pt's sleep study and found that pt did not show any significant sleep apnea, but did note snoring, for which pt can consider an oral appliance made by a qualified dentist. Dr. Frances FurbishAthar recommends that pt follow up with Dr. Duanne Guessewey. I reviewed sleep hygiene recommendations with the pt, including trying to keep a regular sleep wake schedule, avoiding electronics in the bedroom, keeping the bedroom cool, dark, and quiet, and avoiding eating or exercising within 2 hours of bedtime as well as eating in the middle of the night. I advised pt to keep pets out of the bedroom. I discussed with pt the importance of stress relief and to try meditation, deep breathing exercises, and/or a white noise machine or fan to diffuse other noise distractors. I advised pt to not drink alcohol before bedtime and to never mix alcohol and sedating medications. Pt was advised to avoid narcotic pain medication close to bedtime. I advised pt that a copy of these sleep study results will be sent to Dr. Duanne Guessewey. Pt verbalized understanding of results. Pt had no questions at this time but was encouraged to call back if questions arise.

## 2017-05-20 ENCOUNTER — Ambulatory Visit: Payer: Managed Care, Other (non HMO) | Admitting: Registered"

## 2018-11-26 ENCOUNTER — Encounter (HOSPITAL_COMMUNITY): Payer: Self-pay

## 2018-11-26 ENCOUNTER — Other Ambulatory Visit: Payer: Self-pay

## 2018-11-26 ENCOUNTER — Ambulatory Visit (HOSPITAL_COMMUNITY)
Admission: EM | Admit: 2018-11-26 | Discharge: 2018-11-26 | Disposition: A | Discharge status: Home / Self Care | Payer: 59 | Source: Home / Self Care

## 2018-11-26 ENCOUNTER — Emergency Department (HOSPITAL_COMMUNITY): Payer: 59

## 2018-11-26 ENCOUNTER — Emergency Department (HOSPITAL_COMMUNITY)
Admission: EM | Admit: 2018-11-26 | Discharge: 2018-11-26 | Disposition: A | Discharge status: Home / Self Care | Payer: 59 | Attending: Emergency Medicine | Admitting: Emergency Medicine

## 2018-11-26 DIAGNOSIS — R1032 Left lower quadrant pain: Secondary | ICD-10-CM

## 2018-11-26 DIAGNOSIS — K5900 Constipation, unspecified: Secondary | ICD-10-CM | POA: Diagnosis not present

## 2018-11-26 DIAGNOSIS — I10 Essential (primary) hypertension: Secondary | ICD-10-CM | POA: Diagnosis not present

## 2018-11-26 HISTORY — DX: Pure hypercholesterolemia, unspecified: E78.00

## 2018-11-26 HISTORY — DX: Essential (primary) hypertension: I10

## 2018-11-26 HISTORY — DX: Type 2 diabetes mellitus without complications: E11.9

## 2018-11-26 HISTORY — DX: Diverticulitis of intestine, part unspecified, without perforation or abscess without bleeding: K57.92

## 2018-11-26 LAB — CBC
HCT: 41.3 % (ref 36.0–46.0)
Hemoglobin: 12.9 g/dL (ref 12.0–15.0)
MCH: 27.7 pg (ref 26.0–34.0)
MCHC: 31.2 g/dL (ref 30.0–36.0)
MCV: 88.6 fL (ref 80.0–100.0)
Platelets: 286 10*3/uL (ref 150–400)
RBC: 4.66 MIL/uL (ref 3.87–5.11)
RDW: 13.3 % (ref 11.5–15.5)
WBC: 6.1 10*3/uL (ref 4.0–10.5)
nRBC: 0 % (ref 0.0–0.2)

## 2018-11-26 LAB — URINALYSIS, ROUTINE W REFLEX MICROSCOPIC
Bilirubin Urine: NEGATIVE
Glucose, UA: NEGATIVE mg/dL
Hgb urine dipstick: NEGATIVE
Ketones, ur: NEGATIVE mg/dL
Nitrite: NEGATIVE
Protein, ur: NEGATIVE mg/dL
Specific Gravity, Urine: 1.005 (ref 1.005–1.030)
pH: 6 (ref 5.0–8.0)

## 2018-11-26 LAB — COMPREHENSIVE METABOLIC PANEL
ALT: 33 U/L (ref 0–44)
AST: 37 U/L (ref 15–41)
Albumin: 4.1 g/dL (ref 3.5–5.0)
Alkaline Phosphatase: 38 U/L (ref 38–126)
Anion gap: 12 (ref 5–15)
BUN: 9 mg/dL (ref 6–20)
CO2: 26 mmol/L (ref 22–32)
Calcium: 9.4 mg/dL (ref 8.9–10.3)
Chloride: 102 mmol/L (ref 98–111)
Creatinine, Ser: 1.09 mg/dL — ABNORMAL HIGH (ref 0.44–1.00)
GFR calc Af Amer: >60 mL/min (ref >60)
GFR calc non Af Amer: 56 mL/min — ABNORMAL LOW (ref >60)
Glucose, Bld: 152 mg/dL — ABNORMAL HIGH (ref 70–99)
Potassium: 3.4 mmol/L — ABNORMAL LOW (ref 3.5–5.1)
Sodium: 140 mmol/L (ref 135–145)
Total Bilirubin: 0.9 mg/dL (ref 0.3–1.2)
Total Protein: 7.3 g/dL (ref 6.5–8.1)

## 2018-11-26 LAB — I-STAT BETA HCG BLOOD, ED (MC, WL, AP ONLY): I-stat hCG, quantitative: <5 m[IU]/mL (ref <5)

## 2018-11-26 LAB — LIPASE, BLOOD: Lipase: 34 U/L (ref 11–51)

## 2018-11-26 MED ORDER — SODIUM CHLORIDE 0.9% FLUSH: 3.0000 mL | Freq: Once | INTRAVENOUS | Status: DC

## 2018-11-26 MED ORDER — IOHEXOL 300 MG/ML  SOLN
100.0000 mL | Freq: Once | INTRAMUSCULAR | Status: AC | PRN
  Administered 2018-11-26: 100 mL via INTRAVENOUS

## 2018-11-26 NOTE — ED Triage Notes (Signed)
Pt recently dx with diverticulitis last week. Currently being treated with abx. Pt reports increased apisodes of diarrhea and increased pain.

## 2018-11-26 NOTE — ED Notes (Signed)
Patient verbalizes understanding of discharge instructions. Opportunity for questioning and answers were provided. Armband removed by staff, pt discharged from ED ambulatory.   

## 2018-11-26 NOTE — Discharge Instructions (Addendum)
Please go to the ER for further management.

## 2018-11-26 NOTE — ED Triage Notes (Signed)
Pt presents for follow up; Pt is currently being treated for diverticulitis and she has about 3 days left of medicine and states symptoms are not getting any better, pt states her abdominal pain is so bad its difficult to put clothes on

## 2018-11-26 NOTE — ED Provider Notes (Signed)
[facility] Provider Note   CSN: [id number] Arrival date & time: 11/26/18  1430     History   Chief Complaint Chief Complaint  Patient presents with   Abdominal Pain    HPI Katie Mcknight is a 58 y.o. female.     The history is provided by the patient and medical records. No language interpreter was used.  Abdominal Pain  [name] is a 58 y.o. female who presents to the Emergency Department complaining of abdominal pain. She presents the emergency department complaining of one week of left lower quadrant abdominal pain. Initially she had associated subjective fevers, nausea, vomiting, diarrhea. She saw her PCP and was started on ciprofloxacin and Flagyl for presumed diverticulitis. She began to improve with the medications in two days ago developed significant worsening in her left lower quadrant pain. Pain is worse with movement and palpation. She complains of persistent small volume watery stools. No current fevers. No dysuria. She is completed seven days of antibiotics. Symptoms are moderate to severe and worsening in nature. Past Medical History:  Diagnosis Date   Diabetes mellitus without complication ([place])    Diverticulitis    High cholesterol    Hypertension     There are no active problems to display for this patient.   Past Surgical History:  Procedure Laterality Date   ABDOMINAL HYSTERECTOMY     partial     OB History   No obstetric history on file.      Home Medications    Prior to Admission medications   Medication Sig Start Date End Date Taking? Authorizing Provider  Cholecalciferol (VITAMIN D-3) 5000 units TABS Take 5,000 Units by mouth daily.     [provider]  hydrochlorothiazide (HYDRODIURIL) 25 MG tablet Take 25 mg by mouth daily.    [provider]  ondansetron (ZOFRAN ODT) 4 MG disintegrating tablet 4mg  ODT q4 hours prn nausea/vomit 04/23/17   Malvin Johns, MD     Family History Family History  Family history unknown: Yes    Social History Social History   Tobacco Use   Smoking status: Never Smoker   Smokeless tobacco: Never Used  Substance Use Topics   Alcohol use: No   Drug use: No     Allergies   Penicillins   Review of Systems Review of Systems  Gastrointestinal: Positive for abdominal pain.  All other systems reviewed and are negative.    Physical Exam Updated Vital Signs BP 100/66 (BP Location: Right Arm)    Pulse 61    Temp 97.7 F (36.5 C) (Oral)    Resp 14    Ht 5\' 5"  (1.651 m)    Wt 90.7 kg    SpO2 100%    BMI 33.28 kg/m   Physical Exam Vitals signs and nursing note reviewed.  Constitutional:      Appearance: She is well-developed.  HENT:     Head: Normocephalic and atraumatic.  Cardiovascular:     Rate and Rhythm: Normal rate and regular rhythm.     Heart sounds: No murmur.  Pulmonary:     Effort: Pulmonary effort is normal. No respiratory distress.     Breath sounds: Normal breath sounds.  Abdominal:     Palpations: Abdomen is soft.     Tenderness: There is no guarding or rebound.     Comments: Moderate left lower quadrant tenderness. No overlying skin rash.  Musculoskeletal:        General: No swelling  or tenderness.  Skin:    General: Skin is warm and dry.     Capillary Refill: Capillary refill takes less than 2 seconds.  Neurological:     Mental Status: She is alert and oriented to person, place, and time.  Psychiatric:        Behavior: Behavior normal.      ED Treatments / Results  Labs (all labs ordered are listed, but only abnormal results are displayed) Labs Reviewed  COMPREHENSIVE METABOLIC PANEL - Abnormal; Notable for the following components:      Result Value   Potassium 3.4 (*)    Glucose, Bld 152 (*)    Creatinine, Ser 1.09 (*)    GFR calc non Af Amer 56 (*)    All other components within normal limits  URINALYSIS, ROUTINE W REFLEX MICROSCOPIC - Abnormal; Notable for  the following components:   Leukocytes,Ua TRACE (*)    Bacteria, UA RARE (*)    Non Squamous Epithelial 0-5 (*)    All other components within normal limits  LIPASE, BLOOD  CBC  I-STAT BETA HCG BLOOD, ED ([facility], [facility], [facility] ONLY)    EKG None  Radiology Ct Abdomen Pelvis W Contrast  Result Date: 11/26/2018 CLINICAL DATA:  If initial evaluation for acute abdominal pain, diverticulitis suspected. EXAM: CT ABDOMEN AND PELVIS WITH CONTRAST TECHNIQUE: Multidetector CT imaging of the abdomen and pelvis was performed using the standard protocol following bolus administration of intravenous contrast. CONTRAST:  100mL OMNIPAQUE IOHEXOL 300 MG/ML  SOLN COMPARISON:  None available. FINDINGS: Lower chest: Mild scattered subsegmental atelectatic changes seen dependently within the visualized lung bases. Visualized lungs are otherwise clear. Hepatobiliary: Diffuse hypoattenuation liver consistent with steatosis. Liver otherwise unremarkable. Gallbladder within normal limits. No biliary dilatation. Pancreas: Pancreas within normal limits. Spleen: Spleen within normal limits. Adrenals/Urinary Tract: Adrenal glands are normal. Kidneys equal in size with symmetric enhancement. Few scattered subcentimeter hypodensities noted, too small the characterize, but statistically likely reflects small cyst. No nephrolithiasis, hydronephrosis, or focal enhancing renal mass. No hydroureter. Partially distended bladder within normal limits. Stomach/Bowel: Stomach within normal limits. No evidence for bowel obstruction. Normal appendix. Moderate retained stool seen diffusely throughout the colon. No abnormal wall thickening, mucosal enhancement, or inflammatory fat stranding seen about the bowels. Vascular/Lymphatic: Normal intravascular enhancement seen throughout the intra-abdominal aorta. Mesenteric vessels patent proximally. No adenopathy. Reproductive: Sequelae of prior partial hysterectomy. Left ovary within normal limits. Right  ovary not discretely seen. Other: No free air or fluid. Musculoskeletal: No acute osseous abnormality. No discrete lytic or blastic osseous lesions. Moderate degenerative spondylosis noted at L5-S1. IMPRESSION: 1. No CT evidence for acute intra-abdominal or pelvic process. No significant inflammation about the bowels evident by CT. 2. Moderate retained stool diffusely throughout the colon, which could reflect constipation. 3. Hepatic steatosis. Electronically Signed   By: Rise MuBenjamin  McClintock M.D.   On: 11/26/2018 21:22    Procedures Procedures (including critical care time)  Medications Ordered in ED Medications  sodium chloride flush (NS) 0.9 % injection 3 mL (has no administration in time range)  iohexol (OMNIPAQUE) 300 MG/ML solution 100 mL (100 mLs Intravenous Contrast Given 11/26/18 2052)     Initial Impression / Assessment and Plan / ED Course  I have reviewed the triage vital signs and the nursing notes.  Pertinent labs & imaging results that were available during my care of the patient were reviewed by me and considered in my medical decision making (see chart for details).  Patient here for evaluation of left lower quadrant abdominal pain, on antibiotics for diverticulitis. She does have tenderness on examination without peritoneal findings. Labs are reassuring. CT scan is negative for diverticulitis or diverticular abscess. CT is consistent with constipation. Discussed with patient findings of studies. Discussed recommendation for completing her antibiotic course as well as starting stool softener's for constipation. Discussed outpatient follow-up and return precautions.  Final Clinical Impressions(s) / ED Diagnoses   Final diagnoses:  Left lower quadrant abdominal pain  Constipation, unspecified constipation type    ED Discharge Orders    None       Tilden Fossa, MD 11/26/18 2332

## 2018-11-26 NOTE — ED Provider Notes (Addendum)
[facility]    CSN: [id number] Arrival date & time: 11/26/18  4818      History   Chief Complaint Chief Complaint  Patient presents with  . Follow-up  . Abdominal Pain    HPI Katie Mcknight is a 58 y.o. female.   Patient is a 58 year old female the presents today with abdominal pain.  She is currently being treated for diverticulitis.  She has been taking antibiotics for 7 days and feels her symptoms are worsening.  Patient appears uncomfortable.  Denies any fever, nausea, vomiting.  She has had some loose stools.  ROS per HPI      History reviewed. No pertinent past medical history.  There are no active problems to display for this patient.   Past Surgical History:  Procedure Laterality Date  . ABDOMINAL HYSTERECTOMY     partial    OB History   No obstetric history on file.      Home Medications    Prior to Admission medications   Medication Sig Start Date End Date Taking? Authorizing Provider  Cholecalciferol (VITAMIN D-3) 5000 units TABS Take 5,000 Units by mouth daily.     [provider]  hydrochlorothiazide (HYDRODIURIL) 25 MG tablet Take 25 mg by mouth daily.    [provider]  ondansetron (ZOFRAN ODT) 4 MG disintegrating tablet 4mg  ODT q4 hours prn nausea/vomit 04/23/17   04/25/17, MD    Family History Family History  Family history unknown: Yes    Social History Social History   Tobacco Use  . Smoking status: Never Smoker  . Smokeless tobacco: Never Used  Substance Use Topics  . Alcohol use: No  . Drug use: No     Allergies   Penicillins   Review of Systems Review of Systems   Physical Exam Triage Vital Signs ED Triage Vitals  Enc Vitals Group     BP 11/26/18 1033 127/75     Pulse Rate 11/26/18 1033 70     Resp 11/26/18 1033 16     Temp 11/26/18 1033 98.4 F (36.9 C)     Temp Source 11/26/18 1033 Oral     SpO2 11/26/18 1033 99 %     Weight --      Height --      Head  Circumference --      Peak Flow --      Pain Score 11/26/18 1058 0     Pain Loc --      Pain Edu? --      Excl. in GC? --    No data found.  Updated Vital Signs BP 127/75 (BP Location: Right Arm)   Pulse 70   Temp 98.4 F (36.9 C) (Oral)   Resp 16   SpO2 99%   Visual Acuity Right Eye Distance:   Left Eye Distance:   Bilateral Distance:    Right Eye Near:   Left Eye Near:    Bilateral Near:     Physical Exam Vitals signs and nursing note reviewed.  Constitutional:      General: She is not in acute distress.    Appearance: She is not toxic-appearing or diaphoretic.     Comments: Appears uncomfortable  HENT:     Head: Normocephalic.  Pulmonary:     Effort: Pulmonary effort is normal.  Abdominal:     Tenderness: There is abdominal tenderness.  Skin:    General: Skin is warm and dry.  Neurological:     Mental Status:  She is alert.  Psychiatric:        Mood and Affect: Mood normal.      UC Treatments / Results  Labs (all labs ordered are listed, but only abnormal results are displayed) Labs Reviewed - No data to display  EKG   Radiology No results found.  Procedures Procedures (including critical care time)  Medications Ordered in UC Medications - No data to display  Initial Impression / Assessment and Plan / UC Course  I have reviewed the triage vital signs and the nursing notes.  Pertinent labs & imaging results that were available during my care of the patient were reviewed by me and considered in my medical decision making (see chart for details).     Sending patient to the ER for CT scan based on worsening abdominal pain Final Clinical Impressions(s) / UC Diagnoses   Final diagnoses:  Left lower quadrant abdominal pain     Discharge Instructions     Please go to the ER for further management    ED Prescriptions    None     PDMP not reviewed this encounter.      Orvan July, NP 11/26/18 1123

## 2018-11-26 NOTE — ED Notes (Signed)
Katie Mcknight (Sister/POC# [phone]) called for an update.

## 2020-03-30 ENCOUNTER — Other Ambulatory Visit: Payer: Self-pay | Admitting: Family Medicine

## 2020-03-30 DIAGNOSIS — Z1231 Encounter for screening mammogram for malignant neoplasm of breast: Secondary | ICD-10-CM

## 2020-12-10 IMAGING — CT CT ABD-PELV W/ CM
2 of 5 series · 16 of 46 positions shown, 18 images · IV contrast (Omni 300)
Comparison: None available.

CLINICAL DATA: If initial evaluation for acute abdominal pain,
diverticulitis suspected.

EXAM:
CT ABDOMEN AND PELVIS WITH CONTRAST
TECHNIQUE: Multidetector CT imaging of the abdomen and pelvis was performed
using the standard protocol following bolus administration of
intravenous contrast.
CONTRAST:  100mL OMNIPAQUE IOHEXOL 300 MG/ML  SOLN

[Series 3: a/p w/ 5mm · axial · 0.88mm/px · z∈[-827,-432]mm · 13 of 89 slices shown, 15 images]
[im 5/89  soft-tissue]
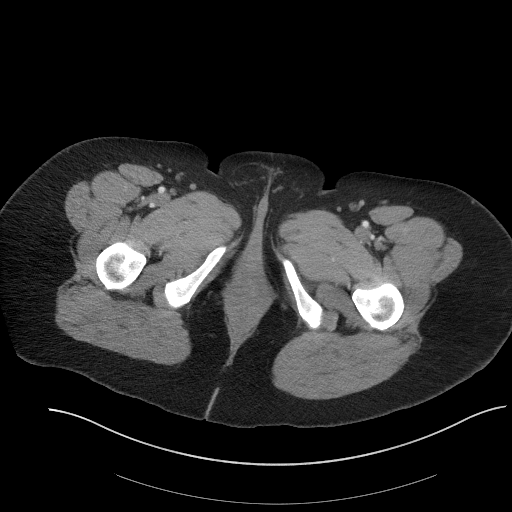
[im 5/89  bone]
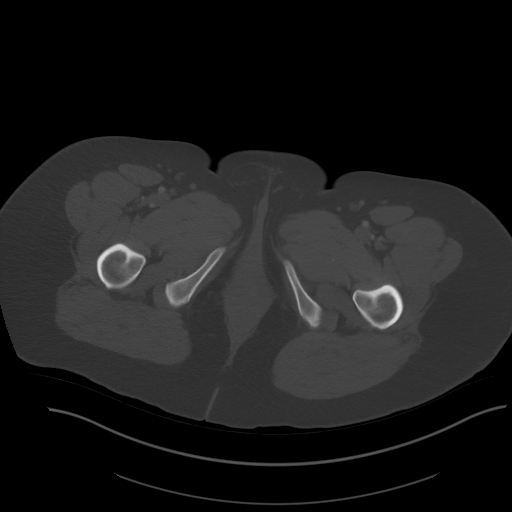
[im 14/89  soft-tissue]
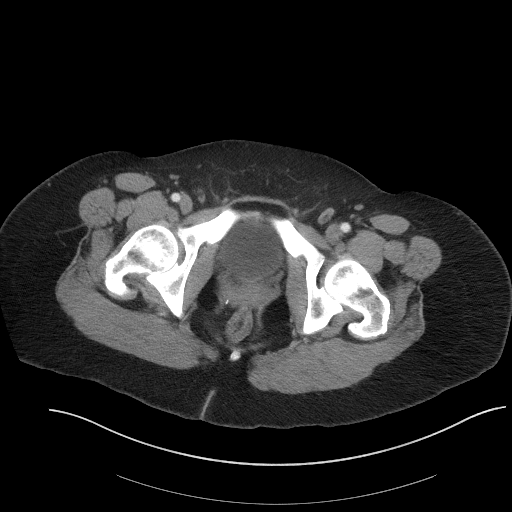
[im 18/89  soft-tissue]
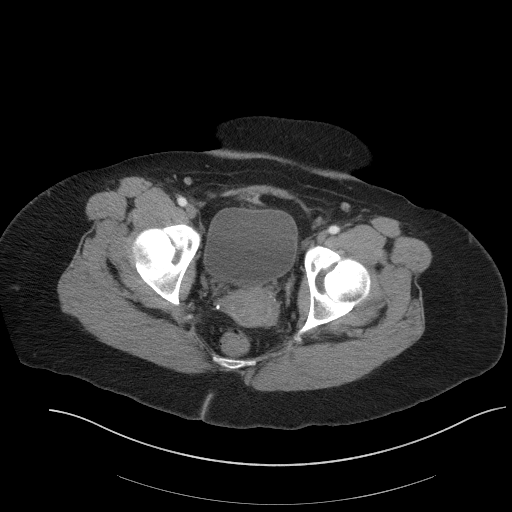
[im 27/89  soft-tissue]
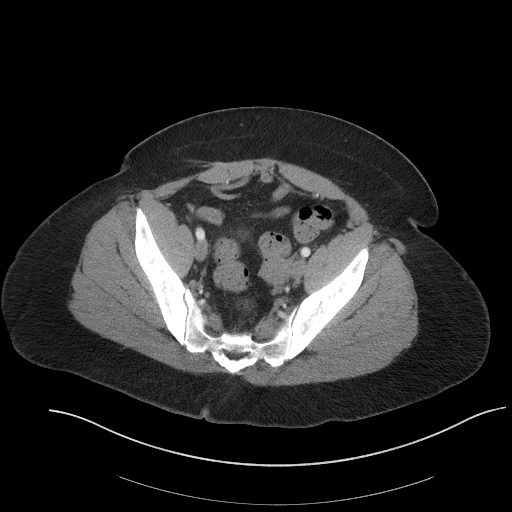
[im 31/89  soft-tissue]
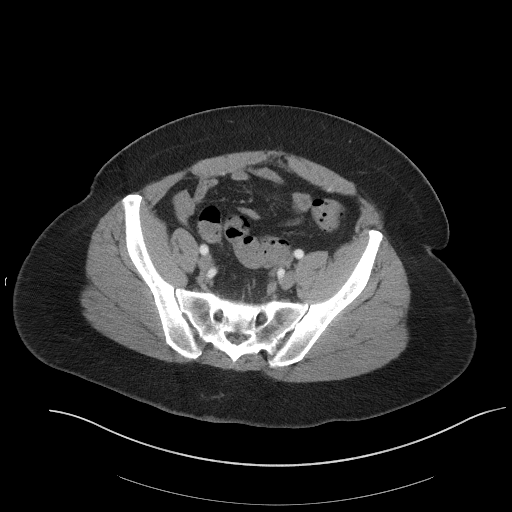
[im 40/89  soft-tissue]
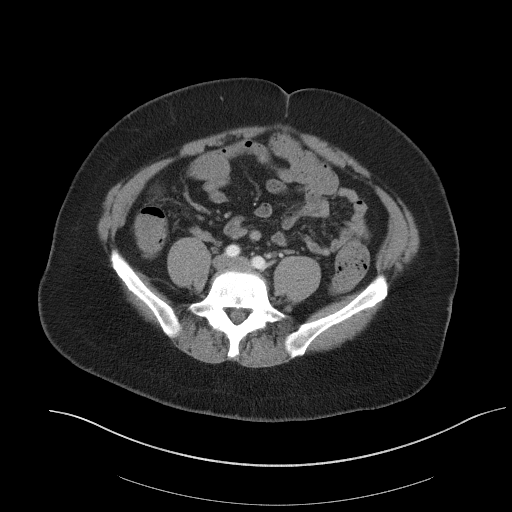
[im 45/89  soft-tissue]
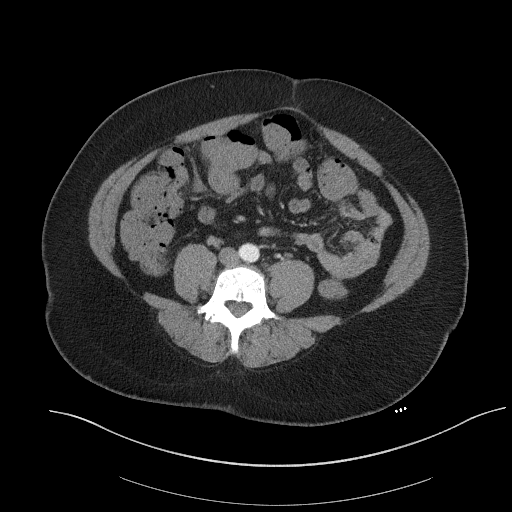
[im 49/89  soft-tissue]
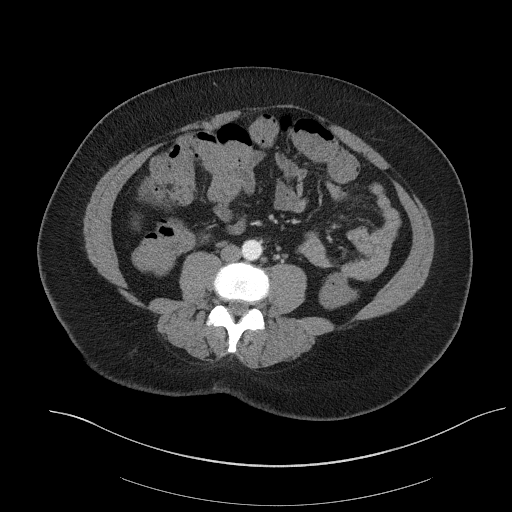
[im 58/89  soft-tissue]
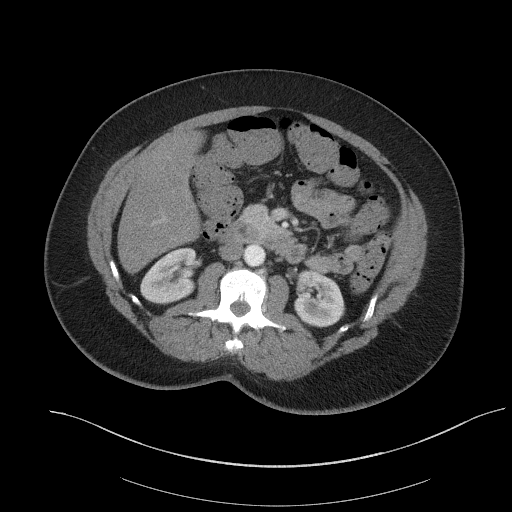
[im 58/89  bone]
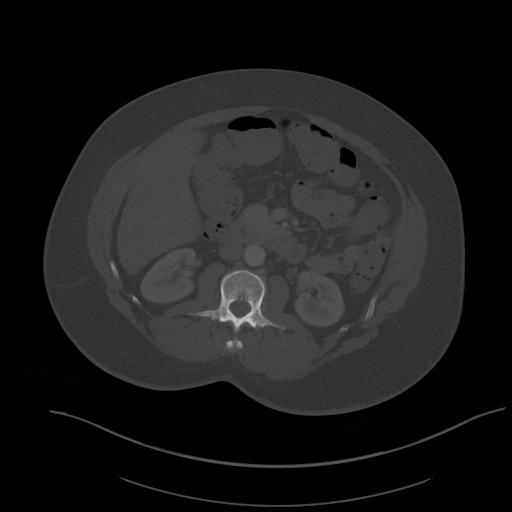
[im 62/89  soft-tissue]
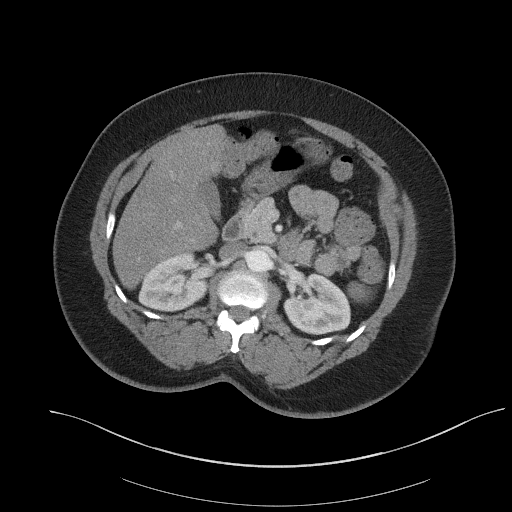
[im 71/89  soft-tissue]
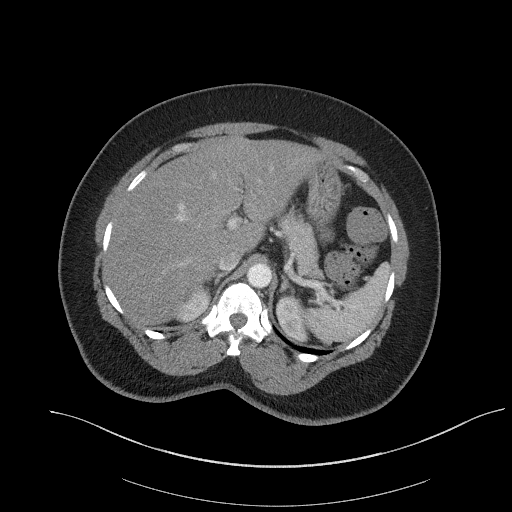
[im 75/89  soft-tissue]
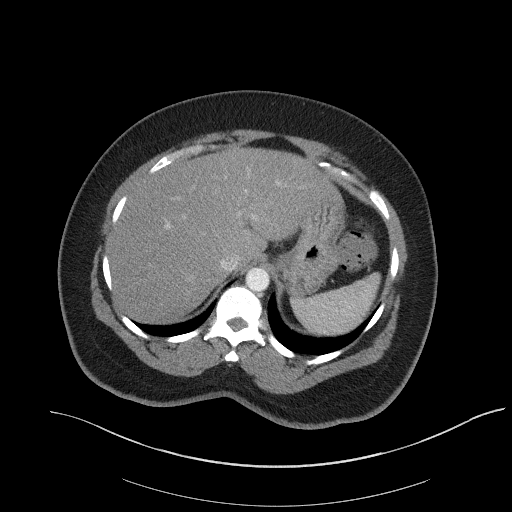
[im 84/89  soft-tissue]
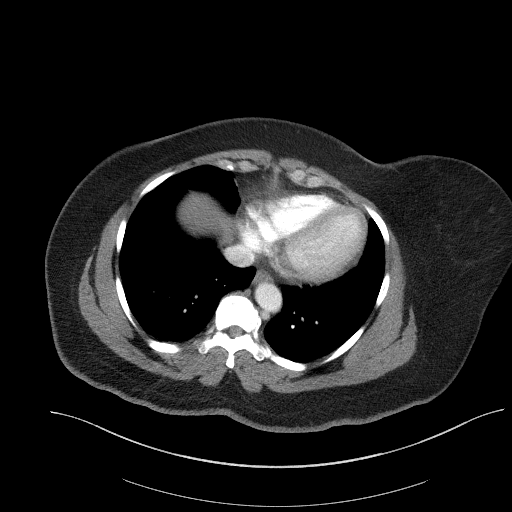

[Series 6: a/p w/ cor · coronal · 0.87mm/px · 3 of 163 slices shown]
[im 55/163  soft-tissue]
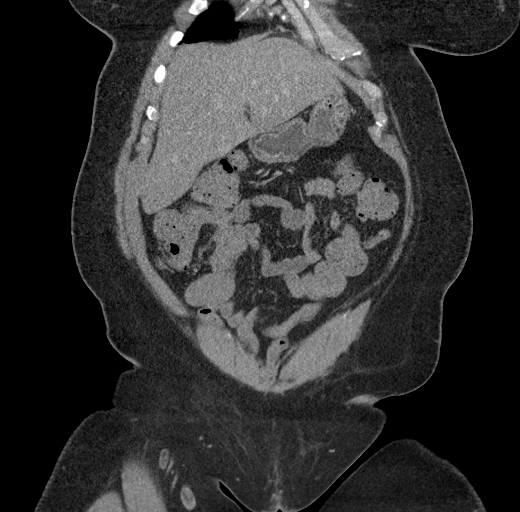
[im 73/163  soft-tissue]
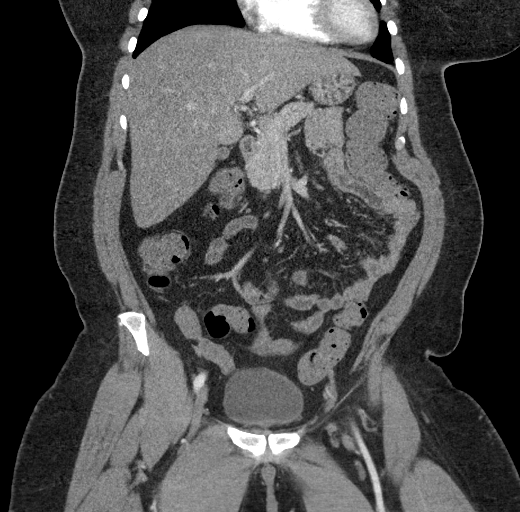
[im 91/163  soft-tissue]
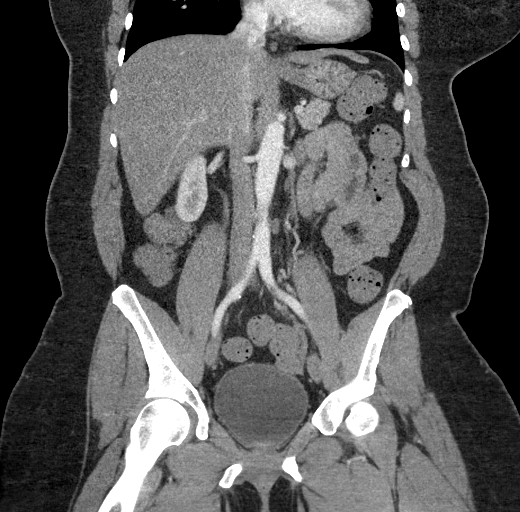

[16 of 46 positions shown; findings below may reference images not displayed]

FINDINGS: Lower chest: Mild scattered subsegmental atelectatic changes seen
dependently within the visualized lung bases. Visualized lungs are
otherwise clear.

Hepatobiliary: Diffuse hypoattenuation liver consistent with
steatosis. Liver otherwise unremarkable. Gallbladder within normal
limits. No biliary dilatation.

Pancreas: Pancreas within normal limits.

Spleen: Spleen within normal limits.

Adrenals/Urinary Tract: Adrenal glands are normal. Kidneys equal in
size with symmetric enhancement. Few scattered subcentimeter
hypodensities noted, too small the characterize, but statistically
likely reflects small cyst. No nephrolithiasis, hydronephrosis, or
focal enhancing renal mass. No hydroureter. Partially distended
bladder within normal limits.

Stomach/Bowel: Stomach within normal limits. No evidence for bowel
obstruction. Normal appendix. Moderate retained stool seen diffusely
throughout the colon. No abnormal wall thickening, mucosal
enhancement, or inflammatory fat stranding seen about the bowels.

Vascular/Lymphatic: Normal intravascular enhancement seen throughout
the intra-abdominal aorta. Mesenteric vessels patent proximally. No
adenopathy.

Reproductive: Sequelae of prior partial hysterectomy. Left ovary
within normal limits. Right ovary not discretely seen.

Other: No free air or fluid.

Musculoskeletal: No acute osseous abnormality. No discrete lytic or
blastic osseous lesions. Moderate degenerative spondylosis noted at
L5-S1.
IMPRESSION: 1. No CT evidence for acute intra-abdominal or pelvic process. No
significant inflammation about the bowels evident by CT.
2. Moderate retained stool diffusely throughout the colon, which
could reflect constipation.
3. Hepatic steatosis.

## 2021-05-18 ENCOUNTER — Other Ambulatory Visit: Payer: Self-pay | Admitting: Family Medicine

## 2021-05-18 DIAGNOSIS — Z1231 Encounter for screening mammogram for malignant neoplasm of breast: Secondary | ICD-10-CM

## 2021-05-25 ENCOUNTER — Ambulatory Visit
Admission: RE | Admit: 2021-05-25 | Discharge: 2021-05-25 | Disposition: A | Discharge status: Home / Self Care | Payer: 59 | Source: Ambulatory Visit | Attending: Family Medicine | Admitting: Family Medicine

## 2021-05-25 DIAGNOSIS — Z1231 Encounter for screening mammogram for malignant neoplasm of breast: Secondary | ICD-10-CM

## 2021-05-29 ENCOUNTER — Other Ambulatory Visit: Payer: Self-pay | Admitting: Family Medicine

## 2021-05-29 DIAGNOSIS — R928 Other abnormal and inconclusive findings on diagnostic imaging of breast: Secondary | ICD-10-CM

## 2021-06-13 ENCOUNTER — Other Ambulatory Visit: Payer: Self-pay | Admitting: Family Medicine

## 2021-06-13 ENCOUNTER — Ambulatory Visit
Admission: RE | Admit: 2021-06-13 | Discharge: 2021-06-13 | Disposition: A | Discharge status: Home / Self Care | Payer: 59 | Source: Ambulatory Visit | Attending: Family Medicine | Admitting: Family Medicine

## 2021-06-13 DIAGNOSIS — R928 Other abnormal and inconclusive findings on diagnostic imaging of breast: Secondary | ICD-10-CM

## 2021-06-13 DIAGNOSIS — N6310 Unspecified lump in the right breast, unspecified quadrant: Secondary | ICD-10-CM

## 2021-09-18 ENCOUNTER — Encounter: Payer: Self-pay | Admitting: *Deleted

## 2021-09-19 ENCOUNTER — Ambulatory Visit (INDEPENDENT_AMBULATORY_CARE_PROVIDER_SITE_OTHER): Payer: 59 | Admitting: Neurology

## 2021-09-19 ENCOUNTER — Encounter: Payer: Self-pay | Admitting: Neurology

## 2021-09-19 VITALS — BP 117/73 | HR 71 | Ht 68.0 in | Wt 211.4 lb

## 2021-09-19 DIAGNOSIS — R002 Palpitations: Secondary | ICD-10-CM

## 2021-09-19 DIAGNOSIS — E669 Obesity, unspecified: Secondary | ICD-10-CM | POA: Diagnosis not present

## 2021-09-19 DIAGNOSIS — R0683 Snoring: Secondary | ICD-10-CM

## 2021-09-19 DIAGNOSIS — G4719 Other hypersomnia: Secondary | ICD-10-CM

## 2021-09-19 DIAGNOSIS — R0689 Other abnormalities of breathing: Secondary | ICD-10-CM | POA: Diagnosis not present

## 2021-09-19 DIAGNOSIS — R351 Nocturia: Secondary | ICD-10-CM

## 2021-09-19 NOTE — Patient Instructions (Signed)
It was nice to see you today.   Here is what we discussed:     Based on your symptoms and your exam I believe you are at risk for obstructive sleep apnea (aka OSA). We should proceed with a sleep study to determine whether you do or do not have OSA and how severe it is. Even, if you have mild OSA, I may want you to consider treatment with CPAP, as treatment of even borderline or mild sleep apnea can result and improvement of symptoms such as sleep disruption, daytime sleepiness, nighttime bathroom breaks, restless leg symptoms, improvement of headache syndromes, even improved mood disorder.   As explained, an attended sleep study (meaning you get to stay overnight in the sleep lab), lets [place] monitor sleep-related behaviors such as sleep talking and leg movements in sleep, in addition to monitoring for sleep apnea.  A home sleep test is a screening tool for sleep apnea diagnosis only, but unfortunately, does not help with any other sleep-related diagnoses.  Please remember, the long-term risks and ramifications of untreated moderate to severe obstructive sleep apnea may include (but are not limited to): increased risk for cardiovascular disease, including congestive heart failure, stroke, difficult to control hypertension, treatment resistant obesity, arrhythmias, especially irregular heartbeat commonly known as A. Fib. (atrial fibrillation); even type 2 diabetes has been linked to untreated OSA.   Other correlations that untreated obstructive sleep apnea include macular edema which is swelling of the retina in the eyes, droopy eyelid syndrome, and elevated hemoglobin and hematocrit levels (often referred to as polycythemia).  Sleep apnea can cause disruption of sleep and sleep deprivation in most cases, which, in turn, can cause recurrent headaches, problems with memory, mood, concentration, focus, and vigilance. Most people with untreated sleep apnea report excessive daytime sleepiness, which can affect  their ability to drive. Please do not drive or use heavy equipment or machinery, if you feel sleepy! Patients with sleep apnea can also develop difficulty initiating and maintaining sleep (aka insomnia).   Having sleep apnea may increase your risk for other sleep disorders, including involuntary behaviors sleep such as sleep terrors, sleep talking, sleepwalking.    Having sleep apnea can also increase your risk for restless leg syndrome and leg movements at night.   Please note that untreated obstructive sleep apnea may carry additional perioperative morbidity. Patients with significant obstructive sleep apnea (typically, in the moderate to severe degree) should receive, if possible, perioperative PAP (positive airway pressure) therapy and the surgeons and particularly the anesthesiologists should be informed of the diagnosis and the severity of the sleep disordered breathing.   We will call you or email you through MyChart with regards to your test results and plan a follow-up in sleep clinic accordingly. Most likely, you will hear from one of our nurses.   Our sleep lab administrative assistant will call you to schedule your sleep study and give you further instructions, regarding the check in process for the sleep study, arrival time, what to bring, when you can expect to leave after the study, etc., and to answer any other logistical questions you may have. If you don't hear back from her by about 2 weeks from now, please feel free to call her direct line at [phone] or you can call our general clinic number, or email [place] through My Chart.

## 2021-09-19 NOTE — Progress Notes (Signed)
Subjective:    Patient ID: Katie Mcknight is a 61 y.o. female.  HPI    Huston Foley, MD, PhD [facility] [address] [address] [place], [place] [address] Dear Lanora Manis,  I saw your patient, Katie Mcknight, upon your kind request in my sleep clinic today for evaluation of her sleep disorder, concern for underlying obstructive sleep apnea.  The patient is unaccompanied today.  As you know, Katie Mcknight is a 61 year old right-handed woman with an underlying medical history of hyperlipidemia, vitamin D deficiency, diabetes, allergies, eczema, anemia, reflux disease, and obesity, who reports snoring and excessive daytime somnolence, waking up with a sense of gasping for air and trouble falling asleep.  I reviewed your office note from 07/26/2021.  I had evaluated her in 2019 for similar concerns.  She had a home sleep test on 05/06/2017 which did not show any significant sleep disordered breathing, overall AHI was 4.5/h, O2 nadir 88%.  Her BMI was 34.7 at the time.  Her Epworth sleepiness score is 8 out of 24, fatigue severity score is 36 out of 63.  She has been working on weight loss and lifestyle modification.  She feels that she was having nighttime panic attacks which have since then improved a little bit.  She has retired, she used to be a Emergency planning/management officer and worked nights and goes to bed late.  Bedtime is generally around 11 but she may not be asleep until 3 or 4 AM.  She has tried melatonin in the past.  She tries to read and listen to prayers.  She does not have a TV on in her bedroom at night.  She has no known family history of sleep apnea, lives with her 70 year old daughter, she also has a 33 year old daughter.  She drinks very little caffeine, has reduced her soda and coffee intake, she had some palpitations from drinking caffeine in the past.  She has nighttime palpitations and sometimes wakes up with difficulty breathing at night.  She has not seen cardiology.   She does not drink alcohol regularly, rare or special occasions only.  She is a non-smoker.  Her rise time depends on when she fell asleep, sometimes 11 AM or even noon.  She has nocturia once per average night and denies any recurrent morning or nocturnal headaches.  Previously:   03/31/17: 61 year old right-handed woman with an underlying medical history of vitamin D deficiency, allergic rhinitis, anemia, irritable bowel syndrome and obesity, who reports snoring and excessive daytime somnolence. She has had witnessed breathing pauses while asleep per family. I reviewed your office note from 02/07/2017, which you kindly included. Her Epworth Sleepiness Scale score is 924 today, fatigue score is 42 out of 63. She wakes up with a sense of gasping, palpitations and shortness of breath. She gets scared at night. She tries to walk around and leave the bedroom when she wakes up with a jolt or startle like this. She lives with her daughter, she is retired. She has 2 children. Her older daughter is 43 years old and lives and works in [place], her younger daughter is 42 and in high school. She is a retired Emergency planning/management officer, retired some 3 years ago. She does not have a very good sleep awake schedule she admits. She goes to bed around 11 but due to her prior work schedule she is awake until early morning hours typically, watches TV in bed, reading, is more active at night she reports. She may be  asleep by 3 AM typically. She wakes up with foot cramping at times. She tries to hydrate well. She does not drink caffeine on a day-to-day basis, does not drink alcohol on a regular basis, is a nonsmoker. Right time varies. She gets up at 7 for her daughter and then goes back to bed and typically may sleep until 3 PM or so. She is not aware of any family history of OSA. He denies telltale symptoms of restless leg syndrome. She does not typically wake up with a headache, she has nocturia about once per average night.     Her  Past Medical History Is Significant For: Past Medical History:  Diagnosis Date   Allergy to tomatoes    Anemia    Breast cyst    Constipation    Diabetes mellitus without complication (HCC)    Diverticulitis    Eczema    Food allergy    Helicobacter pylori gastrointestinal tract infection    Herpes simplex    High cholesterol    Hyperglycemia    Hypertension    Insomnia    Morbid obesity (HCC)    Seasonal allergic rhinitis    Sebaceous cyst    of skin   Sialoadenitis    Tinea pedis     Her Past Surgical History Is Significant For: Past Surgical History:  Procedure Laterality Date   ABDOMINAL HYSTERECTOMY     partial    Her Family History Is Significant For: Family History  Problem Relation Age of Onset   Diabetes Mother    Hypertension Mother    Hypothyroidism Mother    High Cholesterol Mother    Kidney failure Brother    Hypothyroidism Daughter    Breast cancer Neg Hx     Her Social History Is Significant For: Social History   Socioeconomic History   Marital status: Single    Spouse name: Not on file   Number of children: Not on file   Years of education: Not on file   Highest education level: Not on file  Occupational History   Not on file  Tobacco Use   Smoking status: Never   Smokeless tobacco: Never  Substance and Sexual Activity   Alcohol use: No   Drug use: No   Sexual activity: Never  Other Topics Concern   Not on file  Social History Narrative   Not on file   Social Determinants of Health   Financial Resource Strain: Not on file  Food Insecurity: Not on file  Transportation Needs: Not on file  Physical Activity: Not on file  Stress: Not on file  Social Connections: Not on file    Her Allergies Are:  Allergies  Allergen Reactions   Penicillins Anaphylaxis    Has patient had a PCN reaction causing immediate rash, facial/tongue/throat swelling, SOB or lightheadedness with hypotension: Unknown Has patient had a PCN reaction  causing severe rash involving mucus membranes or skin necrosis: Unknown Has patient had a PCN reaction that required hospitalization: Unknown Has patient had a PCN reaction occurring within the last 10 years: Unknown If all of the above answers are "NO", then may proceed with Cephalosporin use.   :   Her Current Medications Are:  Outpatient Encounter Medications as of 09/19/2021  Medication Sig   Cholecalciferol (VITAMIN D-3) 5000 units TABS Take 5,000 Units by mouth daily.    hydrochlorothiazide (HYDRODIURIL) 25 MG tablet Take 25 mg by mouth daily.   Probiotic Product (PROBIOTIC PO) Take by mouth.  rosuvastatin (CRESTOR) 5 MG tablet Take 5 mg by mouth daily.   ondansetron (ZOFRAN ODT) 4 MG disintegrating tablet 4mg  ODT q4 hours prn nausea/vomit   No facility-administered encounter medications on file as of 09/19/2021.  :   Review of Systems:  Out of a complete 14 point review of systems, all are reviewed and negative with the exception of these symptoms as listed below:  Review of Systems  Neurological:        Pt here for sleep consult  Pt snores,hypertension,fatigue  Pt denies headaches,CPAP machine . Pt had a sleep study  2019    ESS:8 FSS:36    Objective:  Neurological Exam  Physical Exam Physical Examination:   Vitals:   09/19/21 1242  BP: 117/73  Pulse: 71    General Examination: The patient is a very pleasant 61 y.o. female in no acute distress. She appears well-developed and well-nourished and well groomed.   HEENT: Normocephalic, atraumatic, pupils are equal, round and reactive to light, extraocular tracking is good without limitation to gaze excursion or nystagmus noted. Hearing is grossly intact. Face is symmetric with normal facial animation. Speech is clear with no dysarthria noted. There is no hypophonia. There is no lip, neck/head, jaw or voice tremor. Neck is supple with full range of passive and active motion. There are no carotid bruits on auscultation.  Oropharynx exam reveals: mild mouth dryness, adequate dental hygiene and moderate airway crowding, due to small airway entry and redundant soft palate.  Tonsillar size of 1-2+, Mallampati class II.  Neck circumference 15 three-quarter inches.  Mild overbite.  Tongue protrudes centrally and palate elevates symmetrically.  Chest: Clear to auscultation without wheezing, rhonchi or crackles noted.  Heart: S1+S2+0, regular and normal without murmurs, rubs or gallops noted.   Abdomen: Soft, non-tender and non-distended.  Extremities: There is no pitting edema in the distal lower extremities bilaterally.   Skin: Warm and dry without trophic changes noted.   Musculoskeletal: exam reveals no obvious joint deformities, tenderness or joint swelling or erythema.   Neurologically:  Mental status: The patient is awake, alert and oriented in all 4 spheres. Her immediate and remote memory, attention, language skills and fund of knowledge are appropriate. There is no evidence of aphasia, agnosia, apraxia or anomia. Speech is clear with normal prosody and enunciation. Thought process is linear. Mood is normal and affect is normal.  Cranial nerves II - XII are as described above under HEENT exam.  Motor exam: Normal bulk, strength and tone is noted. There is no obvious tremor. Fine motor skills and coordination: grossly intact.  Cerebellar testing: No dysmetria or intention tremor. There is no truncal or gait ataxia.  Sensory exam: intact to light touch in the upper and lower extremities.  Gait, station and balance: She stands easily. No veering to one side is noted. No leaning to one side is noted. Posture is age-appropriate and stance is narrow based. Gait shows normal stride length and normal pace. No problems turning are noted.   Assessment and Plan:  In summary, Katie Mcknight is a very pleasant 61 y.o.-year old female with an underlying medical history of hyperlipidemia, vitamin D deficiency,  diabetes, allergies, eczema, anemia, reflux disease, and obesity, whose history and physical exam are concerning for sleep disordered breathing, supporting a current working diagnosis of unspecified sleep apnea, with the main differential diagnoses of obstructive sleep apnea (OSA) versus upper airway resistance syndrome (UARS) versus central sleep apnea (CSA), or mixed sleep apnea. A laboratory attended  sleep study is considered gold standard for evaluation of sleep disordered breathing and is recommended at this time and clinically justified.   I had a long chat with the patient about my findings and the diagnosis of sleep apnea, particularly OSA, its prognosis and treatment options. We talked about medical/conservative treatments, surgical interventions and non-pharmacological approaches for symptom control. I explained, in particular, the risks and ramifications of untreated moderate to severe OSA, especially with respect to developing cardiovascular disease down the road, including congestive heart failure (CHF), difficult to treat hypertension, cardiac arrhythmias (particularly A-fib), neurovascular complications including TIA, stroke and dementia. Even type 2 diabetes has, in part, been linked to untreated OSA. Symptoms of untreated OSA may include (but may not be limited to) daytime sleepiness, nocturia (i.e. frequent nighttime urination), memory problems, mood irritability and suboptimally controlled or worsening mood disorder such as depression and/or anxiety, lack of energy, lack of motivation, physical discomfort, as well as recurrent headaches, especially morning or nocturnal headaches. We talked about the importance of maintaining a healthy lifestyle and striving for healthy weight. In addition, we talked about the importance of striving for and maintaining good sleep hygiene. I recommended the following at this time: sleep study.  I outlined the differences between a laboratory attended sleep study  which is considered more comprehensive and accurate over the option of a home sleep test (HST); the latter may lead to underestimation of sleep disordered breathing in some instances and does not help with diagnosing upper airway resistance syndrome and is not accurate enough to diagnose primary central sleep apnea typically. I explained the different sleep test procedures to the patient in detail and also outlined possible surgical and non-surgical treatment options of OSA, including the use of a pressure airway pressure (PAP) device (ie CPAP, AutoPAP/APAP or BiPAP in certain circumstances), a custom-made dental device (aka oral appliance, which would require a referral to a specialist dentist or orthodontist typically, and is generally speaking not considered a good choice for patients with full dentures or edentulous state), upper airway surgical options, such as traditional UPPP (which is not considered a first-line treatment) or the Inspire device (hypoglossal nerve stimulator, which would involve a referral for consultation with an ENT surgeon, after careful selection, following inclusion criteria). I explained the PAP treatment option to the patient in detail, as this is generally considered first-line treatment.  The patient indicated that she would be willing to try PAP therapy, if the need arises. I explained the importance of being compliant with PAP treatment, not only for insurance purposes but primarily to improve patient's symptoms symptoms, and for the patient's long term health benefit, including to reduce Her cardiovascular risks longer-term.    We will pick up our discussion about the next steps and treatment options after testing.  We will keep her posted as to the test results by phone call and/or MyChart messaging where possible.  We will plan to follow-up in sleep clinic accordingly as well.  I answered all her questions today and the patient was in agreement.   I encouraged her to call  with any interim questions, concerns, problems or updates or email [place] through MyChart.  Generally speaking, sleep test authorizations may take up to 2 weeks, sometimes less, sometimes longer, the patient is encouraged to get in touch with [place] if they do not hear back from the sleep lab staff directly within the next 2 weeks.  Thank you very much for allowing me to participate in the care of this  nice patient. If I can be of any further assistance to you please do not hesitate to call me at [phone].  Sincerely,   Star Age, MD, PhD

## 2021-10-22 ENCOUNTER — Telehealth: Payer: Self-pay | Admitting: Neurology

## 2021-10-22 NOTE — Telephone Encounter (Signed)
pending uploaded notes on the portal

## 2021-11-12 NOTE — Telephone Encounter (Signed)
[facility] Josem Kaufmann: [id number] (exp.10/22/21 to 04/22/22).  Patient is scheduled at [facility] for 12/30/21 at 9 pm.  Mailed packet to the patient.

## 2021-12-17 ENCOUNTER — Other Ambulatory Visit: Payer: 59

## 2021-12-30 ENCOUNTER — Ambulatory Visit (INDEPENDENT_AMBULATORY_CARE_PROVIDER_SITE_OTHER): Payer: 59 | Admitting: Neurology

## 2021-12-30 DIAGNOSIS — R0683 Snoring: Secondary | ICD-10-CM | POA: Diagnosis not present

## 2021-12-30 DIAGNOSIS — G4719 Other hypersomnia: Secondary | ICD-10-CM

## 2021-12-30 DIAGNOSIS — R0689 Other abnormalities of breathing: Secondary | ICD-10-CM

## 2021-12-30 DIAGNOSIS — R351 Nocturia: Secondary | ICD-10-CM

## 2021-12-30 DIAGNOSIS — R002 Palpitations: Secondary | ICD-10-CM

## 2021-12-30 DIAGNOSIS — E669 Obesity, unspecified: Secondary | ICD-10-CM

## 2021-12-30 DIAGNOSIS — G4720 Circadian rhythm sleep disorder, unspecified type: Secondary | ICD-10-CM

## 2021-12-30 DIAGNOSIS — E66811 Obesity, class 1: Secondary | ICD-10-CM

## 2022-01-07 NOTE — Procedures (Signed)
Physician Interpretation:     [facility] POLYSOMNOGRAPHY  INTERPRETATION REPORT   STUDY DATE:  12/30/2021     PATIENT NAME:  Katie Mcknight         DATE OF BIRTH:  01-30-1960  PATIENT ID:  [id number]    TYPE OF STUDY:  PSG  READING PHYSICIAN: Huston Foley, MD, PhD REFERRED BY: Dr. Herma Ard TECHNICIAN: Margaretann Loveless, RPSGT  History: 61 year old right-handed woman with an underlying medical history of hyperlipidemia, vitamin D deficiency, diabetes, allergies, eczema, anemia, reflux disease, and obesity, who reports snoring and excessive daytime somnolence, waking up with a sense of gasping for air and trouble falling asleep. She had a home sleep test in April 2019 which did not show any significant sleep disordered breathing. ?Her Epworth sleepiness score is 8 out of 24, fatigue severity score is 36 out of 63. MEDICATIONS: Vitamin D3, Hydrodiuril, Probiotic, Crestor, Zofran ODT TECHNICAL DESCRIPTION: A registered sleep technologist  was in attendance for the duration of the recording.  Data collection, scoring, video monitoring, and reporting were performed in compliance with the AASM Manual for the Scoring of Sleep and Associated Events; (Hypopnea is scored based on the criteria listed in Section VIII D. 1b in the AASM Manual V2.6 using a 4% oxygen desaturation rule or Hypopnea is scored based on the criteria listed in Section VIII D. 1a in the AASM Manual V2.6 using 3% oxygen desaturation and /or arousal rule).   SLEEP CONTINUITY AND SLEEP ARCHITECTURE:  Lights-out was at 21:29: and lights-on at  05:02:, with  a total recording time of 7 hours, 33 min. Total sleep time ( TST) was 411.5 minutes with a normal sleep efficiency at 90.8%. There was  16.6% REM sleep.  BODY POSITION:  TST was divided  between the following sleep positions: 59.4% supine;  40.6% lateral;  0% prone. Duration of total sleep and percent of total sleep in their respective position is as  follows: supine 244 minutes (59%), non-supine 167 minutes (41%); right 63 minutes (15%), left 104 minutes (25%), and prone 00 minutes (0%).  Total supine REM sleep time was 17 minutes (26% of total REM sleep).  Sleep latency was normal at 5.0 minutes.  REM sleep latency was slightly below normal at 56.0 minutes. Of the total sleep time, the percentage of stage N1 sleep was 4.1%, stage N2 sleep was 65%, which is increased, stage N3 sleep was 14.3%, which is near- normal, and REM sleep was 16.6%, which is mildly reduced. Wake after sleep onset (WASO) time accounted for 36.5 minutes with minimal to mild sleep fragmentation noted.   RESPIRATORY MONITORING:   Based on CMS criteria (using a 4% oxygen desaturation rule for scoring hypopneas), there were 0 apneas (0 obstructive; 0 central; 0 mixed), and 4 hypopneas.  Apnea index was 0.0. Hypopnea index was 0.6. The apnea-hypopnea index was 0.6 overall (0.0 supine, 4 non-supine; 2.6 REM, 0.0 supine REM).  There were 0 respiratory effort-related arousals (RERAs).  The RERA index was 0 events/h. Total respiratory disturbance index (RDI) was 0.6 events/h. RDI results showed: supine RDI  0.0 /h; non-supine RDI 1.4 /h; REM RDI 2.6 /h, supine REM RDI 0.0 /h.   Based on AASM criteria (using a 3% oxygen desaturation and /or arousal rule for scoring hypopneas), there were 0 apneas (0 obstructive; 0 central; 0 mixed), and 23 hypopneas. Apnea index was 0.0. Hypopnea index was 3.4. The apnea-hypopnea index was 3.4 overall (2.2 supine, 12 non-supine; 12.3 REM, 13.7 supine  REM).  There were 0 respiratory effort-related arousals (RERAs).  The RERA index was 0 events/h. Total respiratory disturbance index (RDI) was 3.4 events/h. RDI results showed: supine RDI  2.2 /h; non-supine RDI 5.0 /h; REM RDI 12.3 /h, supine REM RDI 13.7 /h.   OXIMETRY: Oxyhemoglobin Saturation Nadir during sleep was at  89%) from a mean of 96%.  Of the Total sleep time (TST)   hypoxemia (=<88%) was present  for  0.0 minutes, or 0.0% of total sleep time.   LIMB MOVEMENTS: There were 10 periodic limb movements of sleep (1.5/hr), of which 0 (0.0/hr) were associated with an arousal.  AROUSAL: There were 85 arousals in total, for an arousal index of 12 arousals/hour.  Of these, 10 were identified as respiratory-related arousals (1 /h), 0 were PLM-related arousals (0 /h), and 91 were non-specific arousals (13 /h).  EEG: Review of the EEG showed no abnormal electrical discharges and symmetrical bihemispheric findings.    EKG: The EKG revealed normal sinus rhythm (NSR). The average heart rate during sleep was 61 bpm.   AUDIO/VIDEO REVIEW: The audio and video review did not show any abnormal or unusual behaviors, movements, phonations or vocalizations. The patient took 1 restroom break. Snoring was mild to moderate.  POST-STUDY QUESTIONNAIRE: Post study, the patient indicated, that sleep was the same as usual.   IMPRESSION:   1. Primary Snoring 2. Dysfunctions associated with sleep stages or arousal from sleep  RECOMMENDATIONS:  1. This study does not demonstrate any significant obstructive or central sleep disordered breathing with an AHI of less than 5/hour - Her AHI was 3.4/hour - and oxygen saturations at or above 89% for the night. Mild to moderate snoring was noted. Treatment with a positive airway pressure device, such as CPAP or autoPAP is not indicated. Weight loss may aid in reducing her snoring. For disturbing snoring, an oral appliance (through a qualified dentist) can be considered.  2. This study shows sleep fragmentation and mildly abnormal sleep stage percentages; these are nonspecific findings and per se do not signify an intrinsic sleep disorder or a cause for the patient's sleep-related symptoms. Causes include (but are not limited to) the first night effect of the sleep study, circadian rhythm disturbances, medication effect or an underlying mood disorder or medical problem.  3. The  patient should be cautioned not to drive, work at heights, or operate dangerous or heavy equipment when tired or sleepy. Review and reiteration of good sleep hygiene measures should be pursued with any patient. 4. The patient will be advised to follow up with the referring provider, who will be notified of the test results.   I certify that I have reviewed the entire raw data recording prior to the issuance of this report in accordance with the Standards of Accreditation of the American Academy of Sleep Medicine (AASM).  Huston Foley, MD, PhD [facility]) Diplomat, ABPN (Neurology and Sleep)                Technical Report:    General Information  Name: Katie Mcknight, Katie Mcknight BMI: 32.08 Physician: Huston Foley, MD  ID: [id number] Height: 68.0 in Technician: Margaretann Loveless, RPSGT  Sex: Female Weight: 211.0 lb Record: [id number]  Age: 44 [[birth year]] Date: 12/30/2021    Medical & Medication History    61 year old right-handed woman with an underlying medical history of hyperlipidemia, vitamin D deficiency, diabetes, allergies, eczema, anemia, reflux disease, and obesity, who reports snoring and excessive daytime somnolence, waking up with a sense of  gasping for air and trouble falling asleep. Total Apnea/Hypopnea Index (AHI): 4.5/h, RDI was 8.6/h Lowest O2 Desaturation: 88%  Vitamin D3, Hydrodiuril, Probiotic, Crestor, Zofran ODT   Sleep Disorder      Comments   The patient came into the sleep lab for a PSG. She had a prior HST with our sleep lab on 05/06/17. One restroom trip. EKG kept in NSR. Mild to moderate snoring noted. Some PLM's. All respiratory events scored with a 3% desat. The patient slept supine and lateral.     Lights out: 09:29:49 PM Lights on: 05:02:43 AM   Time Total Supine Side Prone Upright  Recording (TRT) 7h 33.1m 4h 25.59m 3h 7.41m 0h 0.56m 0h 0.78m  Sleep (TST) 6h 51.27m 4h 4.62m 2h 47.55m 0h 0.4m 0h 0.69m   Latency N1 N2 N3 REM Onset Per. Slp. Eff.   Actual 0h 0.82m 0h 1.77m 0h 35.57m 0h 56.81m 0h 5.4m 0h 5.39m 90.84%   Stg Dur Wake N1 N2 N3 REM  Total 41.5 17.0 267.0 59.0 68.5  Supine 21.0 11.0 180.5 35.5 17.5  Side 20.5 6.0 86.5 23.5 51.0  Prone 0.0 0.0 0.0 0.0 0.0  Upright 0.0 0.0 0.0 0.0 0.0   Stg % Wake N1 N2 N3 REM  Total 9.2 4.1 64.9 14.3 16.6  Supine 4.6 2.7 43.9 8.6 4.3  Side 4.5 1.5 21.0 5.7 12.4  Prone 0.0 0.0 0.0 0.0 0.0  Upright 0.0 0.0 0.0 0.0 0.0     Apnea Summary Sub Supine Side Prone Upright  Total 0 Total 0 0 0 0 0    REM 0 0 0 0 0    NREM 0 0 0 0 0  Obs 0 REM 0 0 0 0 0    NREM 0 0 0 0 0  Mix 0 REM 0 0 0 0 0    NREM 0 0 0 0 0  Cen 0 REM 0 0 0 0 0    NREM 0 0 0 0 0   Rera Summary Sub Supine Side Prone Upright  Total 0 Total 0 0 0 0 0    REM 0 0 0 0 0    NREM 0 0 0 0 0   Hypopnea Summary Sub Supine Side Prone Upright  Total 23 Total 23 9 14  0 0    REM 14 4 10  0 0    NREM 9 5 4  0 0   4% Hypopnea Summary Sub Supine Side Prone Upright  Total (4%) 4 Total 4 0 4 0 0    REM 3 0 3 0 0    NREM 1 0 1 0 0     AHI Total Obs Mix Cen  3.35 Apnea 0.00 0.00 0.00 0.00   Hypopnea 3.35 -- -- --  0.58 Hypopnea (4%) 0.58 -- -- --    Total Supine Side Prone Upright  Position AHI 3.35 2.21 5.03 0.00 0.00  REM AHI 12.26   NREM AHI 1.57   Position RDI 3.35 2.21 5.03 0.00 0.00  REM RDI 12.26   NREM RDI 1.57    4% Hypopnea Total Supine Side Prone Upright  Position AHI (4%) 0.58 0.00 1.44 0.00 0.00  REM AHI (4%) 2.63   NREM AHI (4%) 0.17   Position RDI (4%) 0.58 0.00 1.44 0.00 0.00  REM RDI (4%) 2.63   NREM RDI (4%) 0.17    Desaturation Information Threshold: 2% <100% <90% <80% <70% <60% <50% <40%  Supine 55.0 0.0 0.0 0.0 0.0 0.0 0.0  Side 48.0 1.0 0.0 0.0 0.0 0.0 0.0  Prone 0.0 0.0 0.0 0.0 0.0 0.0 0.0  Upright 0.0 0.0 0.0 0.0 0.0 0.0 0.0  Total 103.0 1.0 0.0 0.0 0.0 0.0 0.0  Index 13.8 0.1 0.0 0.0 0.0 0.0 0.0   Threshold: 3% <100% <90% <80% <70% <60% <50% <40%  Supine 9.0 0.0 0.0 0.0 0.0 0.0 0.0  Side  15.0 1.0 0.0 0.0 0.0 0.0 0.0  Prone 0.0 0.0 0.0 0.0 0.0 0.0 0.0  Upright 0.0 0.0 0.0 0.0 0.0 0.0 0.0  Total 24.0 1.0 0.0 0.0 0.0 0.0 0.0  Index 3.2 0.1 0.0 0.0 0.0 0.0 0.0   Threshold: 4% <100% <90% <80% <70% <60% <50% <40%  Supine 0.0 0.0 0.0 0.0 0.0 0.0 0.0  Side 5.0 1.0 0.0 0.0 0.0 0.0 0.0  Prone 0.0 0.0 0.0 0.0 0.0 0.0 0.0  Upright 0.0 0.0 0.0 0.0 0.0 0.0 0.0  Total 5.0 1.0 0.0 0.0 0.0 0.0 0.0  Index 0.7 0.1 0.0 0.0 0.0 0.0 0.0   Threshold: 3% <100% <90% <80% <70% <60% <50% <40%  Supine 9 0 0 0 0 0 0  Side 15 1 0 0 0 0 0  Prone 0 0 0 0 0 0 0  Upright 0 0 0 0 0 0 0  Total 24 1 0 0 0 0 0   Awakening/Arousal Information # of Awakenings 32  Wake after sleep onset 36.3542m  Wake after persistent sleep 36.542m   Arousal Assoc. Arousals Index  Apneas 0 0.0  Hypopneas 10 1.5  Leg Movements 0 0.0  Snore 0 0.0  PTT Arousals 0 0.0  Spontaneous 91 13.3  Total 101 14.7  Leg Movement Information PLMS LMs Index  Total LMs during PLMS 10 1.5  LMs w/ Microarousals 0 0.0   LM LMs Index  w/ Microarousal 0 0.0  w/ Awakening 0 0.0  w/ Resp Event 0 0.0  Spontaneous 41 6.0  Total 41 6.0     Desaturation threshold setting: 3% Minimum desaturation setting: 10 seconds SaO2 nadir: 89% The longest event was a 57 sec obstructive Hypopnea with a minimum SaO2 of 93%. The lowest SaO2 was 89% associated with a 37 sec obstructive Hypopnea. EKG Rates EKG Avg Max Min  Awake 69 95 58  Asleep 61 84 52  EKG Events: N/A

## 2022-01-08 ENCOUNTER — Telehealth: Payer: Self-pay | Admitting: *Deleted

## 2022-01-08 NOTE — Telephone Encounter (Signed)
-----   Message from Huston Foley, MD sent at 01/07/2022  6:04 PM EST ----- Patient referred by Dr. Duanne Guess for concern sleep apnea,, seen by me on 09/19/2021, diagnostic PSG on 12/30/2021.   Please call and notify the patient that the recent sleep study did not show any significant obstructive sleep apnea. Her AHI was 3.4/hour - and oxygen saturations at or above 89% for the night. Mild to moderate snoring was noted. Treatment with a positive airway pressure device, such as CPAP or autoPAP is not indicated. Weight loss may aid in reducing her snoring. For disturbing snoring, an oral appliance (through a qualified dentist) can be considered.  If she would like to talk to a dentist about an oral appliance, we can facilitate with a referral.   At this juncture, she can follow-up with her PCP. Thanks,  Huston Foley, MD, PhD [facility])

## 2022-01-08 NOTE — Telephone Encounter (Signed)
Jumping from sleep can be from several reasons including snoring, bad dreams, leg movements.  This is a nonspecific finding.

## 2022-01-08 NOTE — Telephone Encounter (Signed)
I called pt .  Her sleep study results were relayed to her.  She verbalized understanding.  She did want clarification about episode that she had early in sleep study (woke jumping) mild compared to her home experience of jumping out of bed.  Is this the disturbing snoring?  Please advise.

## 2022-01-08 NOTE — Telephone Encounter (Signed)
Pt returned a called. Requesting a call back from nurse.

## 2022-01-08 NOTE — Telephone Encounter (Signed)
I called the pt and LVM with office number asking for call back.

## 2022-01-09 NOTE — Telephone Encounter (Signed)
I called pt and relayed that per Dr. Frances Furbish, that Jumping from sleep can be from several reasons including snoring, bad dreams, leg movements.  This is a nonspecific finding.  Her AHI was 3.4/hr, and that is below 5 in what we are looking at as normal and her oxygen sat level was  at or above 89% and with these findings they did not meet criteria for PAP therapy. Weight loss may aid in the issue of snoring.  Pt verbalized understanding.  She is to call back if questions.

## 2022-01-14 ENCOUNTER — Other Ambulatory Visit: Payer: Self-pay | Admitting: Family Medicine

## 2022-01-14 ENCOUNTER — Ambulatory Visit
Admission: RE | Admit: 2022-01-14 | Discharge: 2022-01-14 | Disposition: A | Discharge status: Home / Self Care | Payer: 59 | Source: Ambulatory Visit | Attending: Family Medicine | Admitting: Family Medicine

## 2022-01-14 DIAGNOSIS — N6310 Unspecified lump in the right breast, unspecified quadrant: Secondary | ICD-10-CM

## 2022-05-27 ENCOUNTER — Other Ambulatory Visit: Payer: 59

## 2022-07-03 ENCOUNTER — Ambulatory Visit
Admission: RE | Admit: 2022-07-03 | Discharge: 2022-07-03 | Disposition: A | Discharge status: Home / Self Care | Payer: 59 | Source: Ambulatory Visit | Attending: Family Medicine | Admitting: Family Medicine

## 2022-07-03 DIAGNOSIS — N6310 Unspecified lump in the right breast, unspecified quadrant: Secondary | ICD-10-CM

## 2023-07-16 ENCOUNTER — Encounter: Payer: Self-pay | Admitting: Family Medicine

## 2023-07-16 ENCOUNTER — Other Ambulatory Visit: Payer: Self-pay | Admitting: Family Medicine

## 2023-07-16 DIAGNOSIS — Z1231 Encounter for screening mammogram for malignant neoplasm of breast: Secondary | ICD-10-CM

## 2023-07-17 ENCOUNTER — Other Ambulatory Visit: Payer: Self-pay | Admitting: Family Medicine

## 2023-07-17 DIAGNOSIS — N6310 Unspecified lump in the right breast, unspecified quadrant: Secondary | ICD-10-CM

## 2023-07-29 ENCOUNTER — Ambulatory Visit
Admission: RE | Admit: 2023-07-29 | Discharge: 2023-07-29 | Disposition: A | Discharge status: Home / Self Care | Source: Ambulatory Visit | Attending: Family Medicine | Admitting: Family Medicine

## 2023-07-29 DIAGNOSIS — N6310 Unspecified lump in the right breast, unspecified quadrant: Secondary | ICD-10-CM
